# Patient Record
Sex: Female | Born: 1970 | Race: White | Hispanic: No | Marital: Married | Smoking: Current every day smoker
Health system: Southern US, Community
[De-identification: ages and names within clinical notes are randomized; demographics above are authoritative.]

## PROBLEM LIST (undated history)

## (undated) DIAGNOSIS — F32A Depression, unspecified: Secondary | ICD-10-CM

## (undated) DIAGNOSIS — K649 Unspecified hemorrhoids: Secondary | ICD-10-CM

## (undated) DIAGNOSIS — F329 Major depressive disorder, single episode, unspecified: Secondary | ICD-10-CM

## (undated) DIAGNOSIS — I10 Essential (primary) hypertension: Secondary | ICD-10-CM

---

## 2004-07-17 ENCOUNTER — Emergency Department: Payer: Self-pay | Admitting: Emergency Medicine

## 2004-10-13 ENCOUNTER — Emergency Department: Payer: Self-pay | Admitting: Emergency Medicine

## 2005-12-26 ENCOUNTER — Emergency Department: Payer: Self-pay | Admitting: Emergency Medicine

## 2006-01-01 ENCOUNTER — Emergency Department: Payer: Self-pay

## 2006-09-21 ENCOUNTER — Emergency Department: Payer: Self-pay | Admitting: Emergency Medicine

## 2006-09-25 ENCOUNTER — Emergency Department: Payer: Self-pay | Admitting: Emergency Medicine

## 2006-12-27 ENCOUNTER — Emergency Department: Payer: Self-pay | Admitting: Emergency Medicine

## 2007-03-02 ENCOUNTER — Emergency Department: Payer: Self-pay | Admitting: Internal Medicine

## 2007-11-28 ENCOUNTER — Emergency Department: Payer: Self-pay

## 2008-01-08 ENCOUNTER — Emergency Department: Payer: Self-pay | Admitting: Emergency Medicine

## 2008-05-13 ENCOUNTER — Emergency Department: Payer: Self-pay

## 2008-07-22 ENCOUNTER — Emergency Department: Payer: Self-pay | Admitting: Internal Medicine

## 2009-03-12 ENCOUNTER — Emergency Department: Payer: Self-pay | Admitting: Unknown Physician Specialty

## 2012-01-11 ENCOUNTER — Emergency Department: Payer: Self-pay | Admitting: Emergency Medicine

## 2012-01-11 LAB — URINALYSIS, COMPLETE
Bilirubin,UR: NEGATIVE
Glucose,UR: NEGATIVE mg/dL (ref 0–75)
Ketone: NEGATIVE
Leukocyte Esterase: NEGATIVE
Ph: 5 (ref 4.5–8.0)
RBC,UR: 5 /HPF (ref 0–5)
Squamous Epithelial: 45
WBC UR: 2 /HPF (ref 0–5)

## 2012-10-12 ENCOUNTER — Emergency Department: Payer: Self-pay | Admitting: Internal Medicine

## 2012-10-12 LAB — COMPREHENSIVE METABOLIC PANEL
Alkaline Phosphatase: 88 U/L (ref 50–136)
Bilirubin,Total: 0.1 mg/dL — ABNORMAL LOW (ref 0.2–1.0)
Chloride: 105 mmol/L (ref 98–107)
Creatinine: 0.56 mg/dL — ABNORMAL LOW (ref 0.60–1.30)
EGFR (African American): >60
Glucose: 93 mg/dL (ref 65–99)
Osmolality: 282 (ref 275–301)
Potassium: 3.6 mmol/L (ref 3.5–5.1)
Sodium: 141 mmol/L (ref 136–145)
Total Protein: 6.8 g/dL (ref 6.4–8.2)

## 2012-10-12 LAB — CBC
HGB: 13.4 g/dL (ref 12.0–16.0)
MCH: 32.9 pg (ref 26.0–34.0)
MCHC: 34.5 g/dL (ref 32.0–36.0)
MCV: 95 fL (ref 80–100)
Platelet: 267 10*3/uL (ref 150–440)
RBC: 4.08 10*6/uL (ref 3.80–5.20)
RDW: 13.5 % (ref 11.5–14.5)
WBC: 9 10*3/uL (ref 3.6–11.0)

## 2012-10-12 LAB — CK TOTAL AND CKMB (NOT AT ARMC)
CK, Total: 71 U/L (ref 21–215)
CK-MB: <0.5 ng/mL — ABNORMAL LOW (ref 0.5–3.6)

## 2012-10-12 LAB — TROPONIN I: Troponin-I: <0.02 ng/mL

## 2014-08-07 ENCOUNTER — Emergency Department
Admit: 2014-08-07 | Disposition: A | Discharge status: Home / Self Care | Payer: Self-pay | Admitting: Emergency Medicine

## 2015-04-18 ENCOUNTER — Encounter: Payer: Self-pay | Admitting: Emergency Medicine

## 2015-04-18 ENCOUNTER — Emergency Department
Admission: EM | Admit: 2015-04-18 | Discharge: 2015-04-18 | Disposition: A | Discharge status: Home / Self Care | Payer: No Typology Code available for payment source | Attending: Emergency Medicine | Admitting: Emergency Medicine

## 2015-04-18 DIAGNOSIS — R197 Diarrhea, unspecified: Secondary | ICD-10-CM | POA: Diagnosis not present

## 2015-04-18 DIAGNOSIS — F17210 Nicotine dependence, cigarettes, uncomplicated: Secondary | ICD-10-CM | POA: Diagnosis not present

## 2015-04-18 DIAGNOSIS — Z3202 Encounter for pregnancy test, result negative: Secondary | ICD-10-CM | POA: Insufficient documentation

## 2015-04-18 DIAGNOSIS — K648 Other hemorrhoids: Secondary | ICD-10-CM | POA: Insufficient documentation

## 2015-04-18 DIAGNOSIS — R109 Unspecified abdominal pain: Secondary | ICD-10-CM | POA: Diagnosis present

## 2015-04-18 DIAGNOSIS — Z79899 Other long term (current) drug therapy: Secondary | ICD-10-CM | POA: Insufficient documentation

## 2015-04-18 DIAGNOSIS — R110 Nausea: Secondary | ICD-10-CM | POA: Diagnosis not present

## 2015-04-18 DIAGNOSIS — R195 Other fecal abnormalities: Secondary | ICD-10-CM

## 2015-04-18 LAB — COMPREHENSIVE METABOLIC PANEL
ALT: 11 U/L — ABNORMAL LOW (ref 14–54)
ANION GAP: 8 (ref 5–15)
AST: 15 U/L (ref 15–41)
Albumin: 4 g/dL (ref 3.5–5.0)
Alkaline Phosphatase: 66 U/L (ref 38–126)
BILIRUBIN TOTAL: 0.5 mg/dL (ref 0.3–1.2)
BUN: 8 mg/dL (ref 6–20)
CHLORIDE: 108 mmol/L (ref 101–111)
CO2: 24 mmol/L (ref 22–32)
Calcium: 8.9 mg/dL (ref 8.9–10.3)
Creatinine, Ser: 0.59 mg/dL (ref 0.44–1.00)
Glucose, Bld: 94 mg/dL (ref 65–99)
POTASSIUM: 3.6 mmol/L (ref 3.5–5.1)
Sodium: 140 mmol/L (ref 135–145)
TOTAL PROTEIN: 7.5 g/dL (ref 6.5–8.1)

## 2015-04-18 LAB — CBC
HCT: 45.5 % (ref 35.0–47.0)
Hemoglobin: 15.7 g/dL (ref 12.0–16.0)
MCH: 34.4 pg — AB (ref 26.0–34.0)
MCHC: 34.5 g/dL (ref 32.0–36.0)
MCV: 99.7 fL (ref 80.0–100.0)
PLATELETS: 156 10*3/uL (ref 150–440)
RBC: 4.56 MIL/uL (ref 3.80–5.20)
RDW: 13.2 % (ref 11.5–14.5)
WBC: 9.8 10*3/uL (ref 3.6–11.0)

## 2015-04-18 LAB — LIPASE, BLOOD: LIPASE: 24 U/L (ref 11–51)

## 2015-04-18 LAB — PREGNANCY, URINE: PREG TEST UR: NEGATIVE

## 2015-04-18 MED ORDER — PHENYLEPHRINE IN HARD FAT 0.25 % RE SUPP: 1.0000 | Freq: Two times a day (BID) | RECTAL | Status: DC

## 2015-04-18 MED ORDER — ONDANSETRON 4 MG PO TBDP: 4.0000 mg | ORAL_TABLET | Freq: Three times a day (TID) | ORAL | Status: DC | PRN

## 2015-04-18 MED ORDER — ONDANSETRON 4 MG PO TBDP
4.0000 mg | ORAL_TABLET | Freq: Once | ORAL | Status: AC
  Administered 2015-04-18: 4 mg via ORAL
  Filled 2015-04-18: qty 1

## 2015-04-18 MED ORDER — LIDOCAINE HCL 2 % EX GEL
1.0000 "application " | Freq: Once | CUTANEOUS | Status: AC
  Administered 2015-04-18: 1 via TOPICAL
  Filled 2015-04-18: qty 5

## 2015-04-18 NOTE — Discharge Instructions (Signed)
For your hemorrhoids, please take the suppositories, take 2 sitz baths daily, and eating a high-fiber diet to prevent constipation which can worsen your hemorrhoids. For your nausea he may take Zofran.   Please return to the emergency department if you develop abdominal pain, fever, bleeding, or any other symptoms concerning to you.

## 2015-04-18 NOTE — ED Notes (Signed)
Pt uprite on stretcher in exam room with no distress noted; st nausea last couple wks; began taking OTC prilosec 9 days ago with relielf; on Thursday began having "pressure to rectum and burning like acid with loose stools"; denies nausea at present, denies urinary c/o; denies hx of same; denies abd pain; +BS, abd soft/nondist; only c/o rectal pain; denies any blood noted in BM

## 2015-04-18 NOTE — ED Provider Notes (Signed)
[facility] Emergency Department Provider Note  ____________________________________________  Time seen: Approximately 7:37 AM  I have reviewed the triage vital signs and the nursing notes.   HISTORY  Chief Complaint Abdominal Pain    HPI Hannah Carey is a 44 y.o. female with a history of hemorrhoids presenting with several weeks of nausea, rectal pressure, and rectal pain with defecation, and diarrhea.  Patient reports nausea without vomiting; no headache. For the past 8 days, she has also had a burning sensation with defecation; she is having loose nonbloody stools with this. She also reports feeling pressure "all day." She reports that she is usually constipated, has not had any change in her diet but does not eat healthily. She has not noted any blood in her stool or dark stools. She is not had fever, foreign body to the rectum, or abdominal pain.She tried Prilosec for her nausea which did not help, and Preparation H for her rectal burning which did not help.   History reviewed. No pertinent past medical history.  There are no active problems to display for this patient.   History reviewed. No pertinent past surgical history.  Current Outpatient Rx  Name  Route  Sig  Dispense  Refill  . Aspirin-Salicylamide-Caffeine (BC HEADACHE POWDER PO)   Oral   Take 1 packet by mouth as needed.         Marland Kitchen. omeprazole (PRILOSEC) 20 MG capsule   Oral   Take 20 mg by mouth daily.         . sertraline (ZOLOFT) 100 MG tablet   Oral   Take 100 mg by mouth daily.           Allergies Review of patient's allergies indicates no known allergies.  No family history on file.  Social History Social History  Substance Use Topics  . Smoking status: Current Every Day Smoker -- 1.00 packs/day    Types: Cigarettes  . Smokeless tobacco: None  . Alcohol Use: No    Review of Systems Constitutional: No fever/chills. No lightheadedness or shortness of  breath. Eyes: No visual changes. ENT: No sore throat. Cardiovascular: Denies chest pain, palpitations. Respiratory: Denies shortness of breath.  No cough. Gastrointestinal: No abdominal pain.  Positive nausea, no vomiting.  Positive diarrhea.  No constipation. Genitourinary: Negative for dysuria. Positive for rectal pain and pressure. Musculoskeletal: Negative for back pain. Skin: Negative for rash. Neurological: Negative for headaches, focal weakness or numbness.  10-point ROS otherwise negative.  ____________________________________________   PHYSICAL EXAM:  VITAL SIGNS: ED Triage Vitals  Enc Vitals Group     BP 04/18/15 0636 134/82 mmHg     Pulse Rate 04/18/15 0636 77     Resp 04/18/15 0636 18     Temp 04/18/15 0636 98.9 F (37.2 C)     Temp src --      SpO2 04/18/15 0636 96 %     Weight 04/18/15 0636 135 lb (61.236 kg)     Height 04/18/15 0636 5\' 3"  (1.6 m)     Head Cir --      Peak Flow --      Pain Score 04/18/15 0637 5     Pain Loc --      Pain Edu? --      Excl. in GC? --     Constitutional: Alert and oriented. Well appearing and in no acute distress. Answer question appropriately. Eyes: Conjunctivae are normal.  EOMI. no scleral icterus. Head: Atraumatic. Nose: No congestion/rhinnorhea. Mouth/Throat:  Mucous membranes are moist.  Neck: No stridor.  Supple.   Cardiovascular: Normal rate, regular rhythm. No murmurs, rubs or gallops.  Respiratory: Normal respiratory effort.  No retractions. Lungs CTAB.  No wheezes, rales or ronchi. Gastrointestinal: Soft and nontender. No distention. No peritoneal signs. Genitourinary: Nonthrombosed nonbleeding hemorrhoid externally at 6:00. Multiple palpable internal hemorrhoids with mild to moderate discomfort. No evidence of fissures. Brown stool. Musculoskeletal: No LE edema.  Neurologic:  Normal speech and language. No gross focal neurologic deficits are appreciated.  Skin:  Skin is warm, dry and intact. No rash  noted. Psychiatric: Mood and affect are normal. Speech and behavior are normal.  Normal judgement.  ____________________________________________   LABS (all labs ordered are listed, but only abnormal results are displayed)  Labs Reviewed  CBC - Abnormal; Notable for the following:    MCH 34.4 (*)    All other components within normal limits  COMPREHENSIVE METABOLIC PANEL - Abnormal; Notable for the following:    ALT 11 (*)    All other components within normal limits  LIPASE, BLOOD  PREGNANCY, URINE   ____________________________________________  EKG  Not indicated ____________________________________________  RADIOLOGY  No results found.  ____________________________________________   PROCEDURES  Procedure(s) performed: None  Critical Care performed: No ____________________________________________   INITIAL IMPRESSION / ASSESSMENT AND PLAN / ED COURSE  Pertinent labs & imaging results that were available during my care of the patient were reviewed by me and considered in my medical decision making (see chart for details).  44 y.o. female with a history of poor diet and constipation presenting with several days of rectal burning associated now with diarrhea and palpable hemorrhoids on exam. The etiology of her nausea is unlikely to be related to her hemorrhoids and let us it is related to pain. I do not think she has acute intracranial pathology. I will evaluate her for electrolyte disturbances and treat her nausea symptomatically. We will also rule out pregnancy. Patient does have hemorrhoids which are likely the cause of her rectal pain and pressure. I do not see or palpate an anal fissure, and given that she is afebrile, nontoxic and that her exam shows mild to moderate discomfort rather than severe discomfort, I think rectal abscess is very unlikely. We will plan symptomatic treatment and have her follow-up with her primary care physician at the [facility]  clinic.  ----------------------------------------- 8:31 AM on 04/18/2015 -----------------------------------------  The patient's labs are reassuring and she is not anemic. She is also not pregnant. I will plan to discharge her home with treatment for hemorrhoids and nausea, and have her follow-up with her primary care physician at the [facility] clinic. The patient's symptoms have improved in the emergency department and she understands return precautions as well as follow-up instructions.  ____________________________________________  FINAL CLINICAL IMPRESSION(S) / ED DIAGNOSES  Final diagnoses:  Nausea  Internal hemorrhoids  Loose stools      NEW MEDICATIONS STARTED DURING THIS VISIT:  New Prescriptions   No medications on file     Rockne Menghini, MD 04/18/15 [phone]

## 2015-04-18 NOTE — ED Notes (Signed)
Pt in with co mid abd pain x 1 week with loose stools and nausea.

## 2015-04-29 ENCOUNTER — Encounter: Payer: Self-pay | Admitting: *Deleted

## 2015-04-29 ENCOUNTER — Emergency Department
Admission: EM | Admit: 2015-04-29 | Discharge: 2015-04-29 | Disposition: A | Discharge status: Home / Self Care | Payer: No Typology Code available for payment source | Attending: Emergency Medicine | Admitting: Emergency Medicine

## 2015-04-29 DIAGNOSIS — N3001 Acute cystitis with hematuria: Secondary | ICD-10-CM | POA: Insufficient documentation

## 2015-04-29 DIAGNOSIS — F17210 Nicotine dependence, cigarettes, uncomplicated: Secondary | ICD-10-CM | POA: Diagnosis not present

## 2015-04-29 DIAGNOSIS — Z3202 Encounter for pregnancy test, result negative: Secondary | ICD-10-CM | POA: Diagnosis not present

## 2015-04-29 DIAGNOSIS — Z79899 Other long term (current) drug therapy: Secondary | ICD-10-CM | POA: Insufficient documentation

## 2015-04-29 DIAGNOSIS — K6289 Other specified diseases of anus and rectum: Secondary | ICD-10-CM | POA: Diagnosis present

## 2015-04-29 HISTORY — DX: Major depressive disorder, single episode, unspecified: F32.9

## 2015-04-29 HISTORY — DX: Depression, unspecified: F32.A

## 2015-04-29 LAB — URINALYSIS COMPLETE WITH MICROSCOPIC (ARMC ONLY)
BACTERIA UA: NONE SEEN
Bilirubin Urine: NEGATIVE
Glucose, UA: NEGATIVE mg/dL
KETONES UR: NEGATIVE mg/dL
Leukocytes, UA: NEGATIVE
NITRITE: NEGATIVE
PH: 5 (ref 5.0–8.0)
PROTEIN: NEGATIVE mg/dL
SPECIFIC GRAVITY, URINE: 1.02 (ref 1.005–1.030)

## 2015-04-29 LAB — POCT PREGNANCY, URINE: PREG TEST UR: NEGATIVE

## 2015-04-29 MED ORDER — IBUPROFEN 800 MG PO TABS: 800.0000 mg | ORAL_TABLET | Freq: Three times a day (TID) | ORAL | Status: DC | PRN

## 2015-04-29 MED ORDER — SULFAMETHOXAZOLE-TRIMETHOPRIM 800-160 MG PO TABS: 1.0000 | ORAL_TABLET | Freq: Two times a day (BID) | ORAL | Status: DC

## 2015-04-29 MED ORDER — CYCLOBENZAPRINE HCL 5 MG PO TABS: 5.0000 mg | ORAL_TABLET | Freq: Three times a day (TID) | ORAL | Status: DC | PRN

## 2015-04-29 NOTE — ED Notes (Addendum)
Pt reports being in ED 12/16 due to rectal pressure. Pt was d/x with hemorrhoids that have since become "worse" per pt. Pt reports pain has moved into vagina and into lower back as well. Pt reports pain feels like muscle spasms. Pt denies vaginal d/c or fevers. Pt reports feeling as though she has to urinate "all the time" but denies burning with urination.

## 2015-04-29 NOTE — Discharge Instructions (Signed)

## 2015-04-29 NOTE — ED Notes (Signed)
Patient to ER for lower back pain, rectal pressure, and vaginal pain.

## 2015-04-29 NOTE — ED Provider Notes (Signed)
[facility] Emergency Department Provider Note  ____________________________________________  Time seen: Approximately 12:12 PM  I have reviewed the triage vital signs and the nursing notes.   HISTORY  Chief Complaint No chief complaint on file.    HPI Hannah Carey is a 44 y.o. female who presents for evaluation of rectal pressure vaginal pressure and pain with urination. Patient states that she was seen here 11 days ago for hemorrhoids and feels like that pain her pressure is now burning and radiating forward down her legs like a muscle spasm. Denies constipation states that she had normal bowel movement this morning.   Past Medical History  Diagnosis Date  . Depression     There are no active problems to display for this patient.   History reviewed. No pertinent past surgical history.  Current Outpatient Rx  Name  Route  Sig  Dispense  Refill  . Aspirin-Salicylamide-Caffeine (BC HEADACHE POWDER PO)   Oral   Take 1 packet by mouth as needed.         . cyclobenzaprine (FLEXERIL) 5 MG tablet   Oral   Take 1 tablet (5 mg total) by mouth every 8 (eight) hours as needed for muscle spasms.   30 tablet   0   . ibuprofen (ADVIL,MOTRIN) 800 MG tablet   Oral   Take 1 tablet (800 mg total) by mouth every 8 (eight) hours as needed.   30 tablet   0   . omeprazole (PRILOSEC) 20 MG capsule   Oral   Take 20 mg by mouth daily.         . ondansetron (ZOFRAN-ODT) 4 MG disintegrating tablet   Oral   Take 1 tablet (4 mg total) by mouth every 8 (eight) hours as needed for nausea or vomiting.   20 tablet   0   . phenylephrine (,USE FOR PREPARATION-H,) 0.25 % suppository   Rectal   Place 1 suppository rectally 2 (two) times daily.   12 suppository   0   . sertraline (ZOLOFT) 100 MG tablet   Oral   Take 100 mg by mouth daily.         Marland Kitchen. sulfamethoxazole-trimethoprim (BACTRIM DS,SEPTRA DS) 800-160 MG tablet   Oral   Take 1 tablet by mouth 2  (two) times daily.   20 tablet   0     Allergies Review of patient's allergies indicates no known allergies.  History reviewed. No pertinent family history.  Social History Social History  Substance Use Topics  . Smoking status: Current Every Day Smoker -- 1.00 packs/day    Types: Cigarettes  . Smokeless tobacco: None  . Alcohol Use: No    Review of Systems Constitutional: No fever/chills Eyes: No visual changes. ENT: No sore throat. Cardiovascular: Denies chest pain. Respiratory: Denies shortness of breath. Gastrointestinal: No abdominal pain.  No nausea, no vomiting.  No diarrhea.  No constipation. Genitourinary: Negative for dysuria. Positive for hemorrhoids and rectal pressure. Musculoskeletal: Negative for back pain. Skin: Negative for rash. Neurological: Negative for headaches, focal weakness or numbness.  10-point ROS otherwise negative.  ____________________________________________   PHYSICAL EXAM:  VITAL SIGNS: ED Triage Vitals  Enc Vitals Group     BP 04/29/15 1200 143/88 mmHg     Pulse Rate 04/29/15 1200 92     Resp 04/29/15 1200 16     Temp 04/29/15 1200 98.1 F (36.7 C)     Temp Source 04/29/15 1200 Oral     SpO2 04/29/15 1200 96 %  Weight 04/29/15 1200 135 lb (61.236 kg)     Height 04/29/15 1200  (1.6 m)     Head Cir --      Peak Flow --      Pain Score 04/29/15 1203 6     Pain Loc --      Pain Edu? --      Excl. in GC? --     Constitutional: Alert and oriented. Well appearing and in no acute distress. Cardiovascular: Normal rate, regular rhythm. Grossly normal heart sounds.  Good peripheral circulation. Respiratory: Normal respiratory effort.  No retractions. Lungs CTAB. Gastrointestinal: Soft and nontender. No distention. No abdominal bruits. No CVA tenderness. Musculoskeletal: No lower extremity tenderness nor edema.  No joint effusions. Neurologic:  Normal speech and language. No gross focal neurologic deficits are appreciated.  No gait instability. Skin:  Skin is warm, dry and intact. No rash noted. Psychiatric: Mood and affect are normal. Speech and behavior are normal.  ____________________________________________   LABS (all labs ordered are listed, but only abnormal results are displayed)  Labs Reviewed  URINALYSIS COMPLETEWITH MICROSCOPIC ([facility] ONLY) - Abnormal; Notable for the following:    Color, Urine YELLOW (*)    APPearance HAZY (*)    Hgb urine dipstick 2+ (*)    Squamous Epithelial / LPF 6-30 (*)    All other components within normal limits  POCT PREGNANCY, URINE     PROCEDURES  Procedure(s) performed: None  Critical Care performed: No  ____________________________________________   INITIAL IMPRESSION / ASSESSMENT AND PLAN / ED COURSE  Pertinent labs & imaging results that were available during my care of the patient were reviewed by me and considered in my medical decision making (see chart for details).  To UTI with hematuria. Rx given for Bactrim DS twice a day Flexeril 5 mg for muscle spasms and Motrin 800 mg 3 times a day. Patient follow-up with PCP or return to the ER with any worsening symptomology. ____________________________________________   FINAL CLINICAL IMPRESSION(S) / ED DIAGNOSES  Final diagnoses:  Acute cystitis with hematuria      Evangeline Dakin, PA-C 04/29/15 1254  Myrna Blazer, MD 04/29/15 [phone]

## 2015-05-06 ENCOUNTER — Emergency Department
Admission: EM | Admit: 2015-05-06 | Discharge: 2015-05-06 | Disposition: A | Discharge status: Home / Self Care | Payer: BLUE CROSS/BLUE SHIELD | Attending: Emergency Medicine | Admitting: Emergency Medicine

## 2015-05-06 ENCOUNTER — Encounter: Payer: Self-pay | Admitting: Emergency Medicine

## 2015-05-06 DIAGNOSIS — M5418 Radiculopathy, sacral and sacrococcygeal region: Secondary | ICD-10-CM | POA: Insufficient documentation

## 2015-05-06 DIAGNOSIS — R200 Anesthesia of skin: Secondary | ICD-10-CM | POA: Diagnosis not present

## 2015-05-06 DIAGNOSIS — Z79899 Other long term (current) drug therapy: Secondary | ICD-10-CM | POA: Diagnosis not present

## 2015-05-06 DIAGNOSIS — F17210 Nicotine dependence, cigarettes, uncomplicated: Secondary | ICD-10-CM | POA: Diagnosis not present

## 2015-05-06 DIAGNOSIS — M533 Sacrococcygeal disorders, not elsewhere classified: Secondary | ICD-10-CM | POA: Diagnosis present

## 2015-05-06 HISTORY — DX: Unspecified hemorrhoids: K64.9

## 2015-05-06 MED ORDER — DEXAMETHASONE SODIUM PHOSPHATE 10 MG/ML IJ SOLN
10.0000 mg | Freq: Once | INTRAMUSCULAR | Status: AC
  Administered 2015-05-06: 10 mg via INTRAMUSCULAR
  Filled 2015-05-06: qty 1

## 2015-05-06 MED ORDER — HYDROCODONE-ACETAMINOPHEN 5-325 MG PO TABS: 1.0000 | ORAL_TABLET | ORAL | Status: DC | PRN

## 2015-05-06 MED ORDER — MELOXICAM 15 MG PO TABS: 15.0000 mg | ORAL_TABLET | Freq: Every day | ORAL | Status: DC

## 2015-05-06 NOTE — Discharge Instructions (Signed)
Lumbosacral Radiculopathy °Lumbosacral radiculopathy is a condition that involves the spinal nerves and nerve roots in the low back and bottom of the spine. The condition develops when these nerves and nerve roots move out of place or become inflamed and cause symptoms. °CAUSES °This condition may be caused by: °· Pressure from a disk that bulges out of place (herniated disk). A disk is a plate of cartilage that separates bones in the spine. °· Disk degeneration. °· A narrowing of the bones of the lower back (spinal stenosis). °· A tumor. °· An infection. °· An injury that places sudden pressure on the disks that cushion the bones of your lower spine. °RISK FACTORS °This condition is more likely to develop in: °· Males aged 30-50 years. °· Females aged 50-60 years. °· People who lift improperly. °· People who are overweight or live a sedentary lifestyle. °· People who smoke. °· People who perform repetitive activities that strain the spine. °SYMPTOMS °Symptoms of this condition include: °· Pain that goes down from the back into the legs (sciatica). This is the most common symptom. The pain may be worse with sitting, coughing, or sneezing. °· Pain and numbness in the arms and legs. °· Muscle weakness. °· Tingling. °· Loss of bladder control or bowel control. °DIAGNOSIS °This condition is diagnosed with a physical exam and medical history. If the pain is lasting, you may have tests, such as: °· MRI scan. °· X-ray. °· CT scan. °· Myelogram. °· Nerve conduction study. °TREATMENT °This condition is often treated with: °· Hot packs and ice applied to affected areas. °· Stretches to improve flexibility. °· Exercises to strengthen back muscles. °· Physical therapy. °· Pain medicine. °· A steroid injection in the spine. °In some cases, no treatment is needed. If the condition is long-lasting (chronic), or if symptoms are severe, treatment may involve surgery or lifestyle changes, such as following a weight loss plan. °HOME  CARE INSTRUCTIONS °Medicines °· Take medicines only as directed by your health care provider. °· Do not drive or operate heavy machinery while taking pain medicine. °Injury Care °· Apply a heat pack to the injured area as directed by your health care provider. °· Apply ice to the affected area: °¨ Put ice in a plastic bag. °¨ Place a towel between your skin and the bag. °¨ Leave the ice on for 20-30 minutes, every 2 hours while you are awake or as needed. Or, leave the ice on for as long as directed by your health care provider. °Other Instructions °· If you were shown how to do any exercises or stretches, do them as directed by your health care provider. °· If your health care provider prescribed a diet or exercise program, follow it as directed. °· Keep all follow-up visits as directed by your health care provider. This is important. °SEEK MEDICAL CARE IF: °· Your pain does not improve over time even when taking pain medicines. °SEEK IMMEDIATE MEDICAL CARE IF: °· Your develop severe pain. °· Your pain suddenly gets worse. °· You develop increasing weakness in your legs. °· You lose the ability to control your bladder or bowel. °· You have difficulty walking or balancing. °· You have a fever. °  °This information is not intended to replace advice given to you by your health care provider. Make sure you discuss any questions you have with your health care provider. °  °Document Released: 04/21/2005 Document Revised: 09/05/2014 Document Reviewed: 04/17/2014 °Elsevier Interactive Patient Education ©2016 [employer] ° °

## 2015-05-06 NOTE — ED Notes (Signed)
Tailbone pain radiates down left leg - dx with hemorrhoids last week but believes that's not the case

## 2015-05-06 NOTE — ED Notes (Signed)
On antibiotic for uti, muscle relaxer and nsaids for this pain

## 2015-05-06 NOTE — ED Notes (Signed)
States she developed pain to tailbone which radiates into left hip about 3 weeks ago  Denies any injury ambulates well

## 2015-05-06 NOTE — ED Provider Notes (Signed)
[facility] Emergency Department Provider Note ____________________________________________  Time seen: Approximately 12:58 PM  I have reviewed the triage vital signs and the nursing notes.   HISTORY  Chief Complaint Tailbone Pain   HPI Hannah Carey is a 45 y.o. female who presents to the emergency department for evaluation of pain in her left buttock that radiates around into the rectal and vaginal area. She states that the pain also occasionally radiates down the posterior aspect of the left leg into the knee and at times into the heel or toes. She states that she has been evaluated for this by her primary care at [facility] clinic as well as  being seen here. She has been advised that she has a hemorrhoid. She does not feel that this is the cause of her symptoms. She states that her primary care provider did a "internal exam." She states that the exam was uncomfortable but not painful. She has had no rectal bleeding. She has had no vaginal discharge. Pain in the buttock and rectum is worse with sitting, but improves with standing. She's had little relief with muscle relaxers and ibuprofen. She states that she stands in a stationary position 8 hours per day and makes repetitive motions side to side.She denies loss of bowel or bladder control.   Past Medical History  Diagnosis Date  . Depression   . Hemorrhoids     There are no active problems to display for this patient.   History reviewed. No pertinent past surgical history.  Current Outpatient Rx  Name  Route  Sig  Dispense  Refill  . Aspirin-Salicylamide-Caffeine (BC HEADACHE POWDER PO)   Oral   Take 1 packet by mouth as needed.         . cyclobenzaprine (FLEXERIL) 5 MG tablet   Oral   Take 1 tablet (5 mg total) by mouth every 8 (eight) hours as needed for muscle spasms.   30 tablet   0   . HYDROcodone-acetaminophen (NORCO/VICODIN) 5-325 MG tablet   Oral   Take 1 tablet by mouth every 4 (four)  hours as needed.   12 tablet   0   . meloxicam (MOBIC) 15 MG tablet   Oral   Take 1 tablet (15 mg total) by mouth daily.   30 tablet   0   . omeprazole (PRILOSEC) 20 MG capsule   Oral   Take 20 mg by mouth daily.         . ondansetron (ZOFRAN-ODT) 4 MG disintegrating tablet   Oral   Take 1 tablet (4 mg total) by mouth every 8 (eight) hours as needed for nausea or vomiting.   20 tablet   0   . sertraline (ZOLOFT) 100 MG tablet   Oral   Take 100 mg by mouth daily.           Allergies Review of patient's allergies indicates no known allergies.  History reviewed. No pertinent family history.  Social History Social History  Substance Use Topics  . Smoking status: Current Every Day Smoker -- 1.00 packs/day    Types: Cigarettes  . Smokeless tobacco: None  . Alcohol Use: No    Review of Systems Constitutional: No fever/chills Eyes: No visual changes. ENT: No sore throat. Cardiovascular: Denies chest pain. Respiratory: Denies shortness of breath. Gastrointestinal: No abdominal pain.  No nausea, no vomiting.  No diarrhea.  No constipation. Genitourinary: Negative for dysuria. Musculoskeletal: Negative for back pain. Skin: Negative for rash. Neurological: Negative for headaches,  focal weakness. Positive for intermittent numbness in the left leg.  10-point ROS otherwise negative.  ____________________________________________   PHYSICAL EXAM:  VITAL SIGNS: ED Triage Vitals  Enc Vitals Group     BP 05/06/15 1128 131/80 mmHg     Pulse Rate 05/06/15 1128 117     Resp 05/06/15 1128 18     Temp 05/06/15 1128 98.3 F (36.8 C)     Temp Source 05/06/15 1128 Oral     SpO2 05/06/15 1128 98 %     Weight 05/06/15 1128 132 lb (59.875 kg)     Height 05/06/15 1128 5\' 3"  (1.6 m)     Head Cir --      Peak Flow --      Pain Score 05/06/15 1128 6     Pain Loc --      Pain Edu? --      Excl. in GC? --     Constitutional: Alert and oriented. Well appearing and in no  acute distress. Eyes: Conjunctivae are normal. PERRL. EOMI. Head: Atraumatic. Nose: No congestion/rhinnorhea. Mouth/Throat: Mucous membranes are moist.  Oropharynx non-erythematous. Neck: No stridor.   Cardiovascular: Normal rate, regular rhythm. Grossly normal heart sounds.  Good peripheral circulation. Respiratory: Normal respiratory effort.  No retractions. Lungs CTAB. Gastrointestinal: Soft and nontender. No distention.  Genitourinary/rectal: No hemorrhoid noted on visual exam. Musculoskeletal: No lower extremity tenderness nor edema.  No joint effusions. Neurologic:  Normal speech and language. No gross focal neurologic deficits are appreciated. No gait instability. Pain and numbness is triggered with straight leg raise at approximately 60 on the left, straight leg raise is negative on the right. Skin:  Skin is warm, dry and intact. No rash noted. Psychiatric: Mood and affect are normal. Speech and behavior are normal.  ____________________________________________   LABS (all labs ordered are listed, but only abnormal results are displayed)  Labs Reviewed - No data to display ____________________________________________  EKG   ____________________________________________  RADIOLOGY  Not indicated ____________________________________________   PROCEDURES  Procedure(s) performed: None  Critical Care performed: No  ____________________________________________   INITIAL IMPRESSION / ASSESSMENT AND PLAN / ED COURSE  Pertinent labs & imaging results that were available during my care of the patient were reviewed by me and considered in my medical decision making (see chart for details).  Patient was given Decadron 10 mg IM while in the emergency department. We discussed standing with her right foot elevated on a small box or stepstool while at work. She will be given a prescription for meloxicam and a three-day dose of Norco to be used for severe pain. She is to  follow-up with orthopedics for symptoms that are not improving with her medications. She was instructed to return to the emergency department for symptoms that change or worsen if she is unable to schedule an appointment with her primary care provider or orthopedics. ____________________________________________   FINAL CLINICAL IMPRESSION(S) / ED DIAGNOSES  Final diagnoses:  Radiculopathy of sacrococcygeal region      Chinita PesterCari B Ayiana Winslett, FNP 05/06/15 1322  Jene Everyobert Kinner, MD 05/06/15 1530

## 2015-05-08 ENCOUNTER — Emergency Department: Payer: BLUE CROSS/BLUE SHIELD

## 2015-05-08 ENCOUNTER — Encounter: Payer: Self-pay | Admitting: *Deleted

## 2015-05-08 ENCOUNTER — Emergency Department
Admission: EM | Admit: 2015-05-08 | Discharge: 2015-05-08 | Disposition: A | Discharge status: Home / Self Care | Payer: BLUE CROSS/BLUE SHIELD | Attending: Emergency Medicine | Admitting: Emergency Medicine

## 2015-05-08 DIAGNOSIS — F17210 Nicotine dependence, cigarettes, uncomplicated: Secondary | ICD-10-CM | POA: Diagnosis not present

## 2015-05-08 DIAGNOSIS — R109 Unspecified abdominal pain: Secondary | ICD-10-CM

## 2015-05-08 DIAGNOSIS — Z791 Long term (current) use of non-steroidal anti-inflammatories (NSAID): Secondary | ICD-10-CM | POA: Insufficient documentation

## 2015-05-08 DIAGNOSIS — Z79899 Other long term (current) drug therapy: Secondary | ICD-10-CM | POA: Insufficient documentation

## 2015-05-08 DIAGNOSIS — R319 Hematuria, unspecified: Secondary | ICD-10-CM | POA: Insufficient documentation

## 2015-05-08 DIAGNOSIS — Z3202 Encounter for pregnancy test, result negative: Secondary | ICD-10-CM | POA: Insufficient documentation

## 2015-05-08 DIAGNOSIS — M5432 Sciatica, left side: Secondary | ICD-10-CM

## 2015-05-08 LAB — CBC WITH DIFFERENTIAL/PLATELET
BASOS PCT: 0 %
Basophils Absolute: 0 10*3/uL (ref 0–0.1)
EOS PCT: 1 %
Eosinophils Absolute: 0.1 10*3/uL (ref 0–0.7)
HEMATOCRIT: 43.4 % (ref 35.0–47.0)
Hemoglobin: 14.9 g/dL (ref 12.0–16.0)
Lymphocytes Relative: 23 %
Lymphs Abs: 2.5 10*3/uL (ref 1.0–3.6)
MCH: 34 pg (ref 26.0–34.0)
MCHC: 34.4 g/dL (ref 32.0–36.0)
MCV: 98.8 fL (ref 80.0–100.0)
MONO ABS: 1 10*3/uL — AB (ref 0.2–0.9)
MONOS PCT: 9 %
NEUTROS ABS: 7.2 10*3/uL — AB (ref 1.4–6.5)
Neutrophils Relative %: 67 %
PLATELETS: 135 10*3/uL — AB (ref 150–440)
RBC: 4.4 MIL/uL (ref 3.80–5.20)
RDW: 13 % (ref 11.5–14.5)
WBC: 11 10*3/uL (ref 3.6–11.0)

## 2015-05-08 LAB — URINALYSIS COMPLETE WITH MICROSCOPIC (ARMC ONLY)
BILIRUBIN URINE: NEGATIVE
Glucose, UA: NEGATIVE mg/dL
Hgb urine dipstick: NEGATIVE
KETONES UR: NEGATIVE mg/dL
LEUKOCYTES UA: NEGATIVE
NITRITE: NEGATIVE
PH: 6 (ref 5.0–8.0)
PROTEIN: NEGATIVE mg/dL
SPECIFIC GRAVITY, URINE: 1.023 (ref 1.005–1.030)

## 2015-05-08 LAB — COMPREHENSIVE METABOLIC PANEL
ALK PHOS: 53 U/L (ref 38–126)
ALT: 9 U/L — ABNORMAL LOW (ref 14–54)
ANION GAP: 7 (ref 5–15)
AST: 13 U/L — ABNORMAL LOW (ref 15–41)
Albumin: 4.1 g/dL (ref 3.5–5.0)
BILIRUBIN TOTAL: 0.2 mg/dL — AB (ref 0.3–1.2)
BUN: 12 mg/dL (ref 6–20)
CALCIUM: 8.9 mg/dL (ref 8.9–10.3)
CO2: 29 mmol/L (ref 22–32)
Chloride: 104 mmol/L (ref 101–111)
Creatinine, Ser: 0.56 mg/dL (ref 0.44–1.00)
GFR calc non Af Amer: >60 mL/min (ref >60)
Glucose, Bld: 95 mg/dL (ref 65–99)
POTASSIUM: 3.2 mmol/L — AB (ref 3.5–5.1)
SODIUM: 140 mmol/L (ref 135–145)
TOTAL PROTEIN: 6.7 g/dL (ref 6.5–8.1)

## 2015-05-08 LAB — LIPASE, BLOOD: Lipase: 20 U/L (ref 11–51)

## 2015-05-08 LAB — PREGNANCY, URINE: Preg Test, Ur: NEGATIVE

## 2015-05-08 MED ORDER — CARISOPRODOL 350 MG PO TABS: 350.0000 mg | ORAL_TABLET | Freq: Three times a day (TID) | ORAL | Status: DC | PRN

## 2015-05-08 MED ORDER — PREDNISONE 20 MG PO TABS
60.0000 mg | ORAL_TABLET | Freq: Once | ORAL | Status: AC
  Administered 2015-05-08: 60 mg via ORAL
  Filled 2015-05-08: qty 3

## 2015-05-08 MED ORDER — METHYLPREDNISOLONE 4 MG PO TABS: ORAL_TABLET | ORAL | Status: DC

## 2015-05-08 MED ORDER — POTASSIUM CHLORIDE CRYS ER 20 MEQ PO TBCR
40.0000 meq | EXTENDED_RELEASE_TABLET | Freq: Once | ORAL | Status: AC
  Administered 2015-05-08: 40 meq via ORAL

## 2015-05-08 NOTE — ED Notes (Signed)
Pt complains of right flank pain with blood in urine for three weeks, pt seen in ER Sunday

## 2015-05-08 NOTE — ED Provider Notes (Signed)
[facility] Emergency Department Provider Note  ____________________________________________  Time seen: Approximately 725 PM  I have reviewed the triage vital signs and the nursing notes.   HISTORY  Chief Complaint Flank Pain    HPI Hannah Carey is a 45 y.o. female with a history of depression and hemorrhoids is presenting today with left flank and buttock pain as well as hematuria. She says that the pain is been ongoing for several weeks and has not been relieved despite treatment for a urinary tract infection as well as muscle relaxers and NSAIDs. He describes the pain as a "spasm" which is worse when she sits. She denies any loss of control of her bowel or bladder.She says that she does have history of kidney stones. Denies any pain to the midline of her back. Says the pain is only mild at this time. Denies any burning with urination at this time.   Past Medical History  Diagnosis Date  . Depression   . Hemorrhoids     There are no active problems to display for this patient.   History reviewed. No pertinent past surgical history.  Current Outpatient Rx  Name  Route  Sig  Dispense  Refill  . Aspirin-Salicylamide-Caffeine (BC HEADACHE POWDER PO)   Oral   Take 1 packet by mouth as needed.         . cyclobenzaprine (FLEXERIL) 5 MG tablet   Oral   Take 1 tablet (5 mg total) by mouth every 8 (eight) hours as needed for muscle spasms.   30 tablet   0   . HYDROcodone-acetaminophen (NORCO/VICODIN) 5-325 MG tablet   Oral   Take 1 tablet by mouth every 4 (four) hours as needed.   12 tablet   0   . meloxicam (MOBIC) 15 MG tablet   Oral   Take 1 tablet (15 mg total) by mouth daily.   30 tablet   0   . omeprazole (PRILOSEC) 20 MG capsule   Oral   Take 20 mg by mouth daily.         . ondansetron (ZOFRAN-ODT) 4 MG disintegrating tablet   Oral   Take 1 tablet (4 mg total) by mouth every 8 (eight) hours as needed for nausea or vomiting.  20 tablet   0   . sertraline (ZOLOFT) 100 MG tablet   Oral   Take 100 mg by mouth daily.           Allergies Review of patient's allergies indicates no known allergies.  No family history on file.  Social History Social History  Substance Use Topics  . Smoking status: Current Every Day Smoker -- 1.00 packs/day    Types: Cigarettes  . Smokeless tobacco: None  . Alcohol Use: No    Review of Systems Constitutional: No fever/chills Eyes: No visual changes. ENT: No sore throat. Cardiovascular: Denies chest pain. Respiratory: Denies shortness of breath. Gastrointestinal: No abdominal pain.  No nausea, no vomiting.  No diarrhea.  No constipation. Genitourinary: Negative for dysuria. Musculoskeletal: Negative for back pain. Skin: Negative for rash. Neurological: Negative for headaches, focal weakness or numbness.  10-point ROS otherwise negative.  ____________________________________________   PHYSICAL EXAM:  VITAL SIGNS: ED Triage Vitals  Enc Vitals Group     BP 05/08/15 1724 132/86 mmHg     Pulse Rate 05/08/15 1724 89     Resp 05/08/15 1724 20     Temp 05/08/15 1724 98.2 F (36.8 C)     Temp Source 05/08/15  1724 Oral     SpO2 05/08/15 1724 96 %     Weight 05/08/15 1724 132 lb (59.875 kg)     Height 05/08/15 1724 5\' 3"  (1.6 m)     Head Cir --      Peak Flow --      Pain Score 05/08/15 1724 5     Pain Loc --      Pain Edu? --      Excl. in GC? --     Constitutional: Alert and oriented. Well appearing and in no acute distress. Eyes: Conjunctivae are normal. PERRL. EOMI. Head: Atraumatic. Nose: No congestion/rhinnorhea. Mouth/Throat: Mucous membranes are moist.  Oropharynx non-erythematous. Neck: No stridor.   Cardiovascular: Normal rate, regular rhythm. Grossly normal heart sounds.  Good peripheral circulation. Respiratory: Normal respiratory effort.  No retractions. Lungs CTAB. Gastrointestinal: Soft and nontender. No distention. No abdominal bruits. No  CVA tenderness. Musculoskeletal: No lower extremity tenderness nor edema.  No joint effusions. Mild tenderness to palpation over the left sacroiliac joint. No saddle anesthesia. 5 out of 5 strength to bilateral lower extremities. Bilateral and equal dorsalis pedis pulses. Neurologic:  Normal speech and language. No gross focal neurologic deficits are appreciated. No gait instability. Skin:  Skin is warm, dry and intact. No rash noted. Psychiatric: Mood and affect are normal. Speech and behavior are normal.  ____________________________________________   LABS (all labs ordered are listed, but only abnormal results are displayed)  Labs Reviewed  URINALYSIS COMPLETEWITH MICROSCOPIC ([facility] ONLY) - Abnormal; Notable for the following:    Color, Urine YELLOW (*)    APPearance CLOUDY (*)    Bacteria, UA RARE (*)    Squamous Epithelial / LPF 6-30 (*)    All other components within normal limits  CBC WITH DIFFERENTIAL/PLATELET - Abnormal; Notable for the following:    Platelets 135 (*)    Neutro Abs 7.2 (*)    Monocytes Absolute 1.0 (*)    All other components within normal limits  COMPREHENSIVE METABOLIC PANEL - Abnormal; Notable for the following:    Potassium 3.2 (*)    AST 13 (*)    ALT 9 (*)    Total Bilirubin 0.2 (*)    All other components within normal limits  LIPASE, BLOOD  PREGNANCY, URINE   ____________________________________________  EKG   ____________________________________________  RADIOLOGY  CAT scan without any acute pathology. No nephrolithiasis or hydroureter. Small growth and adrenal adenoma over the course of the last 7 years. Probable cyst in the midpole left kidney. Normal appendix. ____________________________________________   PROCEDURES   ____________________________________________   INITIAL IMPRESSION / ASSESSMENT AND PLAN / ED COURSE  Pertinent labs & imaging results that were available during my care of the patient were reviewed by me and  considered in my medical decision making (see chart for details).  ----------------------------------------- 9:00 PM on 05/08/2015 -----------------------------------------  Patient resting comfortably at this time. Labs and urine studies are reassuring. Also without any acute pathology on the CAT scan. Given copy of CAT scan report to the patient to take with her to her follow-up appointment. She has a follow-up appointment scheduled on January 10 and says that she will also be discussing her current condition with her primary care tomorrow morning. At this point I do not see any reason to keep the patient here any longer for further workup. History and physical are consistent with sciatic type pain. Already on NSAIDs as well as Flexeril. Will change to Medrol Dosepak as well as switch her muscle  relaxer to [employer]. I discussed this plan with the patient understands and is willing to comply.  also could be sacroiliitis. ____________________________________________   FINAL CLINICAL IMPRESSION(S) / ED DIAGNOSES  Sciatica.    Myrna Blazer, MD 05/08/15 2101

## 2015-05-08 NOTE — Discharge Instructions (Signed)
°  Sciatica °Sciatica is pain, weakness, numbness, or tingling along your sciatic nerve. The nerve starts in the lower back and runs down the back of each leg. Nerve damage or certain conditions pinch or put pressure on the sciatic nerve. This causes the pain, weakness, and other discomforts of sciatica. °HOME CARE  °· Only take medicine as told by your doctor. °· Apply ice to the affected area for 20 minutes. Do this 3-4 times a day for the first 48-72 hours. Then try heat in the same way. °· Exercise, stretch, or do your usual activities if these do not make your pain worse. °· Go to physical therapy as told by your doctor. °· Keep all doctor visits as told. °· Do not wear high heels or shoes that are not supportive. °· Get a firm mattress if your mattress is too soft to lessen pain and discomfort. °GET HELP RIGHT AWAY IF:  °· You cannot control when you poop (bowel movement) or pee (urinate). °· You have more weakness in your lower back, lower belly (pelvis), butt (buttocks), or legs. °· You have redness or puffiness (swelling) of your back. °· You have a burning feeling when you pee. °· You have pain that gets worse when you lie down. °· You have pain that wakes you from your sleep. °· Your pain is worse than past pain. °· Your pain lasts longer than 4 weeks. °· You are suddenly losing weight without reason. °MAKE SURE YOU:  °· Understand these instructions. °· Will watch this condition. °· Will get help right away if you are not doing well or get worse. °  °This information is not intended to replace advice given to you by your health care provider. Make sure you discuss any questions you have with your health care provider. °  °Document Released: 01/29/2008 Document Revised: 01/10/2015 Document Reviewed: 08/31/2011 °Elsevier Interactive Patient Education ©2016 [employer] ° ° °

## 2016-01-26 ENCOUNTER — Emergency Department
Admission: EM | Admit: 2016-01-26 | Discharge: 2016-01-26 | Disposition: A | Discharge status: Home / Self Care | Payer: Self-pay | Attending: Emergency Medicine | Admitting: Emergency Medicine

## 2016-01-26 ENCOUNTER — Emergency Department: Payer: Self-pay

## 2016-01-26 ENCOUNTER — Encounter: Payer: Self-pay | Admitting: Emergency Medicine

## 2016-01-26 DIAGNOSIS — Z79899 Other long term (current) drug therapy: Secondary | ICD-10-CM | POA: Insufficient documentation

## 2016-01-26 DIAGNOSIS — R079 Chest pain, unspecified: Secondary | ICD-10-CM | POA: Insufficient documentation

## 2016-01-26 DIAGNOSIS — F17210 Nicotine dependence, cigarettes, uncomplicated: Secondary | ICD-10-CM | POA: Insufficient documentation

## 2016-01-26 LAB — CBC
HCT: 44.5 % (ref 35.0–47.0)
Hemoglobin: 15.3 g/dL (ref 12.0–16.0)
MCH: 33.9 pg (ref 26.0–34.0)
MCHC: 34.3 g/dL (ref 32.0–36.0)
MCV: 98.7 fL (ref 80.0–100.0)
PLATELETS: 203 10*3/uL (ref 150–440)
RBC: 4.5 MIL/uL (ref 3.80–5.20)
RDW: 13.5 % (ref 11.5–14.5)
WBC: 7 10*3/uL (ref 3.6–11.0)

## 2016-01-26 LAB — BASIC METABOLIC PANEL
Anion gap: 7 (ref 5–15)
BUN: 14 mg/dL (ref 6–20)
CO2: 26 mmol/L (ref 22–32)
CREATININE: 0.59 mg/dL (ref 0.44–1.00)
Calcium: 8.8 mg/dL — ABNORMAL LOW (ref 8.9–10.3)
Chloride: 108 mmol/L (ref 101–111)
GFR calc Af Amer: >60 mL/min (ref >60)
Glucose, Bld: 72 mg/dL (ref 65–99)
POTASSIUM: 3.7 mmol/L (ref 3.5–5.1)
SODIUM: 141 mmol/L (ref 135–145)

## 2016-01-26 LAB — URINALYSIS COMPLETE WITH MICROSCOPIC (ARMC ONLY)
BILIRUBIN URINE: NEGATIVE
Bacteria, UA: NONE SEEN
Glucose, UA: NEGATIVE mg/dL
KETONES UR: NEGATIVE mg/dL
LEUKOCYTES UA: NEGATIVE
NITRITE: NEGATIVE
PH: 6 (ref 5.0–8.0)
Protein, ur: NEGATIVE mg/dL
Specific Gravity, Urine: 1.004 — ABNORMAL LOW (ref 1.005–1.030)

## 2016-01-26 LAB — CBC WITH DIFFERENTIAL/PLATELET
Basophils Absolute: 0.1 10*3/uL (ref 0–0.1)
Basophils Relative: 1 %
EOS PCT: 3 %
Eosinophils Absolute: 0.2 10*3/uL (ref 0–0.7)
HCT: 42.8 % (ref 35.0–47.0)
Hemoglobin: 15 g/dL (ref 12.0–16.0)
LYMPHS ABS: 2.2 10*3/uL (ref 1.0–3.6)
LYMPHS PCT: 30 %
MCH: 34.6 pg — AB (ref 26.0–34.0)
MCHC: 35.1 g/dL (ref 32.0–36.0)
MCV: 98.5 fL (ref 80.0–100.0)
MONO ABS: 0.7 10*3/uL (ref 0.2–0.9)
Monocytes Relative: 9 %
Neutro Abs: 4.1 10*3/uL (ref 1.4–6.5)
Neutrophils Relative %: 57 %
PLATELETS: 191 10*3/uL (ref 150–440)
RBC: 4.35 MIL/uL (ref 3.80–5.20)
RDW: 13.6 % (ref 11.5–14.5)
WBC: 7.1 10*3/uL (ref 3.6–11.0)

## 2016-01-26 LAB — HEPATIC FUNCTION PANEL
ALK PHOS: 53 U/L (ref 38–126)
ALT: 15 U/L (ref 14–54)
AST: 20 U/L (ref 15–41)
Albumin: 3.7 g/dL (ref 3.5–5.0)
BILIRUBIN TOTAL: 0.4 mg/dL (ref 0.3–1.2)
Total Protein: 7.1 g/dL (ref 6.5–8.1)

## 2016-01-26 LAB — PREGNANCY, URINE: Preg Test, Ur: NEGATIVE

## 2016-01-26 LAB — GLUCOSE, CAPILLARY: Glucose-Capillary: 77 mg/dL (ref 65–99)

## 2016-01-26 LAB — TROPONIN I: Troponin I: <0.03 ng/mL (ref <0.03)

## 2016-01-26 NOTE — Discharge Instructions (Signed)
Please call Dr. Duke Salviaandolph the cardiologist and arrange follow-up to check on your chest pain further. Please also follow-up with your regular doctor. Please return if you feel worse. All the tests that we've done today are essentially normal.

## 2016-01-26 NOTE — ED Provider Notes (Signed)
[facility] Emergency Department Provider Note   ____________________________________________   First MD Initiated Contact with Patient 01/26/16 [phone]     (approximate)  I have reviewed the triage vital signs and the nursing notes.   HISTORY  Chief Complaint Dizziness    HPI Hannah Carey is a 45 y.o. female who reports she's been kind of feeling "strange" for the last few days. Possibly even the last few weeks. She had a little bit of a headache like sinus infection and blurry vision yesterday today she got up at 3:00 with her right arm aching and then later on her chest felt funny perhaps a little bit tight she's been feeling lightheaded and weak all over. Her head has not been feeling right for the last few days either. She cannot explain it any better than that. Nothing seems to bring it on or make it worse. She thought maybe her arm was aching because she does production work making socks. She uses her arm a lot.Patient also reports she's a little short of breath with exertion. This is been especially prevalent in the last week again with exertion. Patient occasionally has some chest tightness with exertion but not always.  Past Medical History:  Diagnosis Date  . Depression   . Hemorrhoids     There are no active problems to display for this patient.   History reviewed. No pertinent surgical history.  Prior to Admission medications   Medication Sig Start Date End Date Taking? Authorizing Provider  Aspirin-Salicylamide-Caffeine (BC HEADACHE POWDER PO) Take 1 packet by mouth as needed.    Historical Provider, MD  carisoprodol (SOMA) 350 MG tablet Take 1 tablet (350 mg total) by mouth 3 (three) times daily as needed for muscle spasms. 05/08/15   Myrna Blazer, MD  cyclobenzaprine (FLEXERIL) 5 MG tablet Take 1 tablet (5 mg total) by mouth every 8 (eight) hours as needed for muscle spasms. 04/29/15   Evangeline Dakin, PA-C    HYDROcodone-acetaminophen (NORCO/VICODIN) 5-325 MG tablet Take 1 tablet by mouth every 4 (four) hours as needed. 05/06/15   Chinita Pester, FNP  meloxicam (MOBIC) 15 MG tablet Take 1 tablet (15 mg total) by mouth daily. 05/06/15   Chinita Pester, FNP  methylPREDNISolone (MEDROL) 4 MG tablet 14 day dosepack.  Follow directions on packaging. 05/08/15   Myrna Blazer, MD  omeprazole (PRILOSEC) 20 MG capsule Take 20 mg by mouth daily.    Historical Provider, MD  ondansetron (ZOFRAN-ODT) 4 MG disintegrating tablet Take 1 tablet (4 mg total) by mouth every 8 (eight) hours as needed for nausea or vomiting. 04/18/15   Rockne Menghini, MD  sertraline (ZOLOFT) 100 MG tablet Take 100 mg by mouth daily.    Historical Provider, MD    Allergies Review of patient's allergies indicates no known allergies.  No family history on file.  Social History Social History  Substance Use Topics  . Smoking status: Current Every Day Smoker    Packs/day: 1.00    Types: Cigarettes  . Smokeless tobacco: Never Used  . Alcohol use No    Review of Systems Constitutional: No fever/chills Eyes: No visual changesAt present vision was blurry yesterday. ENT: No sore throat. Cardiovascular: See history of present illness Respiratory: Denies shortness of breath at present. Gastrointestinal: No abdominal pain.  No nausea, no vomiting.  No diarrhea.  No constipation. Genitourinary: Negative for dysuria. Musculoskeletal: Negative for back pain. Skin: Negative for rash. Neurological: Negative for headaches, focal weakness  or numbness.  10-point ROS otherwise negative.  ____________________________________________   PHYSICAL EXAM:  VITAL SIGNS: ED Triage Vitals  Enc Vitals Group     BP 01/26/16 0720 (!) 189/88     Pulse Rate 01/26/16 0720 83     Resp 01/26/16 0720 15     Temp 01/26/16 0720 98.4 F (36.9 C)     Temp Source 01/26/16 0720 Oral     SpO2 01/26/16 0720 99 %     Weight 01/26/16 0721 120  lb (54.4 kg)     Height 01/26/16 0721 5\' 3"  (1.6 m)     Head Circumference --      Peak Flow --      Pain Score --      Pain Loc --      Pain Edu? --      Excl. in GC? --     Constitutional: Alert and oriented. Well appearing and in no acute distress. Eyes: Conjunctivae are normal. PERRL. EOMI. Head: Atraumatic. Nose: No congestion/rhinnorhea. Mouth/Throat: Mucous membranes are moist.  Oropharynx non-erythematous. Neck: No stridor.  Cardiovascular: Normal rate, regular rhythm. Grossly normal heart sounds.  Good peripheral circulation. Respiratory: Normal respiratory effort.  No retractions. Lungs CTAB. Gastrointestinal: Soft and nontender. No distention. No abdominal bruits. No CVA tenderness. }Musculoskeletal: No lower extremity tenderness nor edema.  No joint effusions. Neurologic:  Normal speech and language. No gross focal neurologic deficits are appreciated. Cranial nerves II through XII are intact cerebellar rapid alternating movements in the hands and finger to nose are normal motor strength is 5 over 5 throughout sensation is intact throughout No gait instability. Skin:  Skin is warm, dry and intact. No rash noted. Psychiatric: Mood and affect are normal. Speech and behavior are normal.  ____________________________________________   LABS (all labs ordered are listed, but only abnormal results are displayed)  Labs Reviewed  BASIC METABOLIC PANEL - Abnormal; Notable for the following:       Result Value   Calcium 8.8 (*)    All other components within normal limits  URINALYSIS COMPLETEWITH MICROSCOPIC ([facility] ONLY) - Abnormal; Notable for the following:    Color, Urine STRAW (*)    APPearance CLEAR (*)    Specific Gravity, Urine 1.004 (*)    Hgb urine dipstick 1+ (*)    Squamous Epithelial / LPF 0-5 (*)    All other components within normal limits  CBC WITH DIFFERENTIAL/PLATELET - Abnormal; Notable for the following:    MCH 34.6 (*)    All other components within  normal limits  HEPATIC FUNCTION PANEL - Abnormal; Notable for the following:    Bilirubin, Direct <0.1 (*)    All other components within normal limits  CBC  GLUCOSE, CAPILLARY  TROPONIN I  PREGNANCY, URINE  CBG MONITORING, ED   ____________________________________________  EKG EKG read and interpreted by me shows normal sinus rhythm rate of 93 normal axis no acute ST-T wave changes ____________________________________________  RADIOLOGY  CLINICAL DATA:  Night sweats  EXAM: CT HEAD WITHOUT CONTRAST  TECHNIQUE: Contiguous axial images were obtained from the base of the skull through the vertex without intravenous contrast.  COMPARISON:  None.  FINDINGS: Brain: No mass effect, midline shift, or acute hemorrhage. No mass effect, midline shift, or acute hemorrhage.  Vascular: Unremarkable.  Skull: Cranium is intact.  Sinuses/Orbits: Mastoid air cells are clear. Minimal mucus in the left posterior ethmoid air cell.  Other: Noncontributory.  IMPRESSION: No acute intracranial pathology.   Electronically Signed   By:  Jolaine ClickArthur  Hoss M.D.   On: 01/26/2016 10:55 __________CLINICAL DATA:  Chest pain for the past few days. Smoker. No known heart/lung disease  EXAM: PORTABLE CHEST 1 VIEW  COMPARISON:  10/12/2012  FINDINGS: Midline trachea. Borderline cardiomegaly. Mediastinal contours otherwise within normal limits. No pleural effusion or pneumothorax. Clear lungs.  IMPRESSION: Borderline cardiomegaly, without acute disease.   Electronically Signed   By: Jeronimo GreavesKyle  Talbot M.D.   On: 01/26/2016 09:31 __________________________________   PROCEDURES  Procedure(s) performed:   Procedures  Critical Care performed:   ____________________________________________   INITIAL IMPRESSION / ASSESSMENT AND PLAN / ED COURSE  Pertinent labs & imaging results that were available during my care of the patient were reviewed by me and considered in my  medical decision making (see chart for details).  CT chest x-ray troponin and EKG lab work is all essentially within normal limits M uncertain as to the etiology of her diffuse aches and pains weakness and dizziness. I will have her follow-up with her regular doctor and because of the chest pain and occasional shortness of breath with exertion and also the cardiologist. I will only do one troponin since the chest pain has been intermittent for several days.  Clinical Course     ____________________________________________   FINAL CLINICAL IMPRESSION(S) / ED DIAGNOSES  Final diagnoses:  Chest pain, unspecified chest pain type      NEW MEDICATIONS STARTED DURING THIS VISIT:  New Prescriptions   No medications on file     Note:  This document was prepared using Dragon voice recognition software and may include unintentional dictation errors.    Arnaldo NatalPaul F Malinda, MD 01/26/16 [phone]

## 2016-01-26 NOTE — ED Triage Notes (Signed)
Pt reports being light headed, having night sweats and states that her head does not feel right.  NAD, skin warm and dry.

## 2016-01-28 ENCOUNTER — Encounter: Payer: Self-pay | Admitting: Cardiology

## 2016-01-28 ENCOUNTER — Encounter: Payer: Self-pay | Admitting: *Deleted

## 2016-01-28 ENCOUNTER — Ambulatory Visit (INDEPENDENT_AMBULATORY_CARE_PROVIDER_SITE_OTHER): Payer: Self-pay | Admitting: Cardiology

## 2016-01-28 VITALS — BP 150/100 | HR 86 | Ht 63.0 in | Wt 129.4 lb

## 2016-01-28 DIAGNOSIS — R079 Chest pain, unspecified: Secondary | ICD-10-CM

## 2016-01-28 DIAGNOSIS — Z7689 Persons encountering health services in other specified circumstances: Secondary | ICD-10-CM

## 2016-01-28 DIAGNOSIS — I10 Essential (primary) hypertension: Secondary | ICD-10-CM

## 2016-01-28 DIAGNOSIS — Z7189 Other specified counseling: Secondary | ICD-10-CM

## 2016-01-28 MED ORDER — AMLODIPINE BESYLATE 5 MG PO TABS: 5.0000 mg | ORAL_TABLET | Freq: Every day | ORAL | 3 refills | Status: DC

## 2016-01-28 NOTE — Patient Instructions (Addendum)
Medication Instructions:  Your physician has recommended you make the following change in your medication:  1. Amlodipine 5 mg Once Daily 2. STOP taking BC powders  Make sure to follow up with your primary care provider for sinus issues.   Your physician has requested that you have an echocardiogram. Echocardiography is a painless test that uses sound waves to create images of your heart. It provides your doctor with information about the size and shape of your heart and how well your heart's chambers and valves are working. This procedure takes approximately one hour. There are no restrictions for this procedure.     Follow-Up: Your physician recommends that you schedule a follow-up appointment in: 2 weeks with Dr. Alvino ChapelIngal.  It was a pleasure seeing you today here in the office. Please do not hesitate to give us a call back if you have any further questions. [phone]  Red Jacket CellarPamela A. RN, BSN

## 2016-01-28 NOTE — Progress Notes (Signed)
Cardiology Office Note   Date:  01/29/2016   ID:  Hannah AoLorie A Salberg, DOB 12/02/1970, MRN [id number]  [facility]  [facility]   Cardiologist:   Almond LintAileen Branon Sabine, MD   Reason for consultation:  Chief Complaint  Patient presents with  . Establish Care    ED f/u   CP    History of Present Illness: Hannah Carey is a 45 y.o. female who presents forEvaluation of chest pain.  Recently she presented to the ER with episode of dizziness and blurring of vision and chest discomfort. Chest discomfort was substernal, mild intensity, lasting minutes at a time, nonradiating, resolving spontaneously. New onset. ER record her blood pressure 189/88. She has no prior history of hypertension. Patient reports history of headache times one week prior to ER visit.  Patient denies PND, orthopnea, edema. No abdominal pain. No syncope. No palpitations.    ROS:  Please see the history of present illness. Aside from mentioned under HPI, all other systems are reviewed and negative.     Past Medical History:  Diagnosis Date  . Depression   . Hemorrhoids     History reviewed. No pertinent surgical history.   reports that she has been smoking Cigarettes.  She has been smoking about 1.00 pack per day. She has never used smokeless tobacco. She reports that she does not drink alcohol or use drugs.   family history is not on file.   Outpatient Medications Prior to Visit  Medication Sig Dispense Refill  . Aspirin-Salicylamide-Caffeine (BC HEADACHE POWDER PO) Take 1 packet by mouth as needed.    Marland Kitchen. omeprazole (PRILOSEC) 20 MG capsule Take 20 mg by mouth daily.    . carisoprodol (SOMA) 350 MG tablet Take 1 tablet (350 mg total) by mouth 3 (three) times daily as needed for muscle spasms. 15 tablet 0  . cyclobenzaprine (FLEXERIL) 5 MG tablet Take 1 tablet (5 mg total) by mouth every 8 (eight) hours as needed for muscle spasms. 30 tablet 0  . HYDROcodone-acetaminophen (NORCO/VICODIN) 5-325  MG tablet Take 1 tablet by mouth every 4 (four) hours as needed. 12 tablet 0  . meloxicam (MOBIC) 15 MG tablet Take 1 tablet (15 mg total) by mouth daily. 30 tablet 0  . methylPREDNISolone (MEDROL) 4 MG tablet 14 day dosepack.  Follow directions on packaging. 21 tablet 0  . ondansetron (ZOFRAN-ODT) 4 MG disintegrating tablet Take 1 tablet (4 mg total) by mouth every 8 (eight) hours as needed for nausea or vomiting. 20 tablet 0  . sertraline (ZOLOFT) 100 MG tablet Take 100 mg by mouth daily.     No facility-administered medications prior to visit.      Allergies: Review of patient's allergies indicates no known allergies.    PHYSICAL EXAM: VS:  BP (!) 150/100 (BP Location: Right Arm, Patient Position: Sitting, Cuff Size: Normal)   Pulse 86   Ht 5\' 3"  (1.6 m)   Wt 129 lb 6.4 oz (58.7 kg)   SpO2 98%   BMI 22.92 kg/m  , Body mass index is 22.92 kg/m. Wt Readings from Last 3 Encounters:  01/28/16 129 lb 6.4 oz (58.7 kg)  01/26/16 120 lb (54.4 kg)  05/08/15 132 lb (59.9 kg)    GENERAL:  well developed, well nourished, not in acute distress HEENT: normocephalic, pink conjunctivae, anicteric sclerae, no xanthelasma, normal dentition, oropharynx clear NECK:  no neck vein engorgement, JVP normal, no hepatojugular reflux, carotid upstroke brisk and symmetric, no bruit, no thyromegaly, no  lymphadenopathy LUNGS:  good respiratory effort, clear to auscultation bilaterally CV:  PMI not displaced, no thrills, no lifts, S1 and S2 within normal limits, no palpable S3 or S4, no murmurs, no rubs, no gallops ABD:  Soft, nontender, nondistended, normoactive bowel sounds, no abdominal aortic bruit, no hepatomegaly, no splenomegaly MS: nontender back, no kyphosis, no scoliosis, no joint deformities EXT:  2+ DP/PT pulses, no edema, no varicosities, no cyanosis, no clubbing SKIN: warm, nondiaphoretic, normal turgor, no ulcers NEUROPSYCH: alert, oriented to person, place, and time, sensory/motor grossly  intact, normal mood, appropriate affect  Recent Labs: 01/26/2016: ALT 15; BUN 14; Creatinine, Ser 0.59; Hemoglobin 15.3; Hemoglobin 15.0; Platelets 203; Platelets 191; Potassium 3.7; Sodium 141   Lipid Panel No results found for: CHOL, TRIG, HDL, CHOLHDL, VLDL, LDLCALC, LDLDIRECT   Other studies Reviewed:  EKG:  The ekg from 01/26/2016 was personally reviewed by me and it revealed sinus rhythm, 93 BPM. L AE.  Additional studies/ records that were reviewed personally reviewed by me today include: None available   ASSESSMENT AND PLAN:  Elevated blood pressure Episode of chest discomfort, dizziness, blurred vision - may be related to elevated blood pressure Patient likely has diagnoses of hypertension. Recommend to start amlodipine 5 mg by mouth daily. Monitor blood pressure. Goal is less than 140/80. Recommend echocardiogram.  If symptoms of chest discomfort persist despite controlling blood pressure, will come in to do ischemia evaluation at that time.  Tobacco use We discussed the importance of smoking cessation and different strategies for quitting.    Current medicines are reviewed at length with the patient today.  The patient does not have concerns regarding medicines.  Labs/ tests ordered today include:  Orders Placed This Encounter  Procedures  . ECHOCARDIOGRAM COMPLETE    I had a lengthy and detailed discussion with the patient regarding diagnoses, prognosis, diagnostic options, treatment options, and side effects of medications.   I counseled the patient on importance of lifestyle modification including heart healthy diet, regular physical activity Once cardiac workup is completed , and smoking cessation.   Disposition:   FU with undersigned in 2 weeks with blood pressure log  Signed, Almond Lint, MD  01/29/2016 9:51 AM    [facility]  This note was generated in part with voice recognition software and I apologize for any typographical  errors that were not detected and corrected.

## 2016-02-04 ENCOUNTER — Other Ambulatory Visit: Payer: Self-pay

## 2016-02-06 ENCOUNTER — Encounter: Payer: Self-pay | Admitting: Cardiology

## 2016-02-07 ENCOUNTER — Ambulatory Visit (INDEPENDENT_AMBULATORY_CARE_PROVIDER_SITE_OTHER): Payer: Self-pay

## 2016-02-07 ENCOUNTER — Other Ambulatory Visit: Payer: Self-pay

## 2016-02-07 DIAGNOSIS — R079 Chest pain, unspecified: Secondary | ICD-10-CM

## 2016-02-11 ENCOUNTER — Encounter: Payer: Self-pay | Admitting: Cardiology

## 2016-02-11 NOTE — Telephone Encounter (Signed)
Called and spoke with patient at length about her symptoms. She states that she has not been getting better. She is having headaches where it feels like it is behind her eyes, her feet feel weird, legs/knees feel weak. She has been on antibiotics for sinus infection and has not gotten better. She reports that they have put her on 2 different antibiotics after going for 2 visits. She also reported to have some nausea as well. Instructed her to monitor her blood pressures, she states that her most recent blood pressure was 100/72. She was concerned about not getting better. Let her know to follow up with her primary care physician because her symptoms sound related to the sinus infection. Instructed her to keep hydrated and to try and eat something with her medications to try and reduce any nausea. She verbalized understanding and had no further questions at this time.

## 2016-02-19 ENCOUNTER — Encounter: Payer: Self-pay | Admitting: Cardiology

## 2016-02-19 ENCOUNTER — Ambulatory Visit (INDEPENDENT_AMBULATORY_CARE_PROVIDER_SITE_OTHER): Payer: Self-pay | Admitting: Cardiology

## 2016-02-19 VITALS — BP 120/90 | HR 98 | Ht 63.0 in | Wt 122.1 lb

## 2016-02-19 DIAGNOSIS — I351 Nonrheumatic aortic (valve) insufficiency: Secondary | ICD-10-CM

## 2016-02-19 DIAGNOSIS — I10 Essential (primary) hypertension: Secondary | ICD-10-CM

## 2016-02-19 DIAGNOSIS — R002 Palpitations: Secondary | ICD-10-CM

## 2016-02-19 NOTE — Progress Notes (Signed)
Cardiology Office Note   Date:  02/20/2016   ID:  Hannah AoLorie A Mccamy, DOB 09/15/1970, MRN [id number]  [facility]:  [facility]   Cardiologist:   Almond LintAileen Alacia Rehmann, MD   Reason for consultation:  Chief Complaint  Patient presents with  . Follow-up    NO COMPLAINTS.     History of Present Illness: Hannah Carey is a 45 y.o. female who presents for Follow-up after tests.  Since last visit, no recurrence of chest discomfort.  Her blood pressures have improved with initiation of medication.  Patient reports palpitations. Symptoms mainly in the chest, nonradiating, fluttering sensation, mild in intensity, lasting minutes at a time, no known precipitating or palliating factors.  Patient denies shortness of breath, PND, orthopnea, edema. No abdominal pain. No syncope. No palpitations.   ROS:  Please see the history of present illness. Aside from mentioned under HPI, all other systems are reviewed and negative.     Past Medical History:  Diagnosis Date  . Depression   . Hemorrhoids     No past surgical history on file.   reports that she has been smoking Cigarettes.  She has been smoking about 1.00 pack per day. She has never used smokeless tobacco. She reports that she does not drink alcohol or use drugs.   family history is not on file.   Outpatient Medications Prior to Visit  Medication Sig Dispense Refill  . amLODipine (NORVASC) 5 MG tablet Take 1 tablet (5 mg total) by mouth daily. 30 tablet 3  . Aspirin-Salicylamide-Caffeine (BC HEADACHE POWDER PO) Take 1 packet by mouth as needed.    Marland Kitchen. omeprazole (PRILOSEC) 20 MG capsule Take 20 mg by mouth daily.    . sertraline (ZOLOFT) 100 MG tablet Take 150 mg by mouth daily.     No facility-administered medications prior to visit.      Allergies: Review of patient's allergies indicates no known allergies.    PHYSICAL EXAM: VS:  BP 120/90 (BP Location: Left Arm, Patient Position: Sitting, Cuff Size:  Normal)   Pulse 98   Ht 5\' 3"  (1.6 m)   Wt 122 lb 1.9 oz (55.4 kg)   BMI 21.63 kg/m  , Body mass index is 21.63 kg/m. Wt Readings from Last 3 Encounters:  02/19/16 122 lb 1.9 oz (55.4 kg)  01/28/16 129 lb 6.4 oz (58.7 kg)  01/26/16 120 lb (54.4 kg)    GENERAL:  well developed, well nourished, not in acute distress HEENT: normocephalic, pink conjunctivae, anicteric sclerae, no xanthelasma, normal dentition, oropharynx clear NECK:  no neck vein engorgement, JVP normal, no hepatojugular reflux, carotid upstroke brisk and symmetric, no bruit, no thyromegaly, no lymphadenopathy LUNGS:  good respiratory effort, clear to auscultation bilaterally CV:  PMI not displaced, no thrills, no lifts, S1 and S2 within normal limits, no palpable S3 or S4, no murmurs, no rubs, no gallops ABD:  Soft, nontender, nondistended, normoactive bowel sounds, no abdominal aortic bruit, no hepatomegaly, no splenomegaly MS: nontender back, no kyphosis, no scoliosis, no joint deformities EXT:  2+ DP/PT pulses, no edema, no varicosities, no cyanosis, no clubbing SKIN: warm, nondiaphoretic, normal turgor, no ulcers NEUROPSYCH: alert, oriented to person, place, and time, sensory/motor grossly intact, normal mood, appropriate affect  Recent Labs: 01/26/2016: ALT 15; BUN 14; Creatinine, Ser 0.59; Hemoglobin 15.3; Hemoglobin 15.0; Platelets 203; Platelets 191; Potassium 3.7; Sodium 141   Lipid Panel No results found for: CHOL, TRIG, HDL, CHOLHDL, VLDL, LDLCALC, LDLDIRECT   Other studies Reviewed:  EKG:  The ekg from 01/26/2016 was personally reviewed by me and it revealed sinus rhythm, 93 BPM. L AE.  Additional studies/ records that were reviewed personally reviewed by me today include:  Echo 02/07/2016: Left ventricle: The cavity size was normal. Systolic function was   normal. The estimated ejection fraction was in the range of 60%   to 65%. Wall motion was normal; there were no regional wall   motion  abnormalities. Left ventricular diastolic function   parameters were normal. - Aortic valve: There was trivial regurgitation. - Left atrium: The atrium was normal in size. - Right ventricle: Systolic function was normal. - Pulmonary arteries: Systolic pressure was within the normal   range.  Impressions:  - Normal study.   ASSESSMENT AND PLAN:  Elevated blood pressure/hypertension Episode of chest discomfort, dizziness, blurred vision - may be related to elevated blood pressure --- no recurrence since controlled blood pressure Continue amlodipine 5 mg by mouth daily. Monitor blood pressure. Goal is less than 140/80. LVEF within normal limits and echo.  Trivial  Aortic regurgitation Serial evaluation recommended, likely repeat echo in one year  Palpitations Recommend 24-hour Holter monitor  Tobacco use We discussed the importance of smoking cessation and different strategies for quitting.    Current medicines are reviewed at length with the patient today.  The patient does not have concerns regarding medicines.  Labs/ tests ordered today include:  Orders Placed This Encounter  Procedures  . Holter monitor - 24 hour    I had a lengthy and detailed discussion with the patient regarding diagnoses, prognosis, diagnostic options, treatment options, and side effects of medications.   I counseled the patient on importance of lifestyle modification including heart healthy diet, regular physical activity , and smoking cessation.   Disposition:   FU with After tests  Signed, Almond Lint, MD  02/20/2016 8:45 AM    [facility]  This note was generated in part with voice recognition software and I apologize for any typographical errors that were not detected and corrected.

## 2016-02-19 NOTE — Patient Instructions (Addendum)
Testing/Procedures: Your physician has recommended that you wear a holter monitor. Holter monitors are medical devices that record the heart's electrical activity. Doctors most often use these monitors to diagnose arrhythmias. Arrhythmias are problems with the speed or rhythm of the heartbeat. The monitor is a small, portable device. You can wear one while you do your normal daily activities. This is usually used to diagnose what is causing palpitations/syncope (passing out).    Follow-Up: Your physician recommends that you schedule a follow-up appointment as needed with Dr. Alvino ChapelIngal. We will call you with results of holter monitor.   It was a pleasure seeing you today here in the office. Please do not hesitate to give us a call back if you have any further questions. [phone]  [place] CellarPamela A. RN, BSN      Holter Monitoring A Holter monitor is a small device that is used to detect abnormal heart rhythms. It clips to your clothing and is connected by wires to flat, sticky disks (electrodes) that attach to your chest. It is worn continuously for 24-48 hours. HOME CARE INSTRUCTIONS  Wear your Holter monitor at all times, even while exercising and sleeping, for as long as directed by your health care provider.  Make sure that the Holter monitor is safely clipped to your clothing or close to your body as recommended by your health care provider.  Do not get the monitor or wires wet.  Do not put body lotion or moisturizer on your chest.  Keep your skin clean.  Keep a diary of your daily activities, such as walking and doing chores. If you feel that your heartbeat is abnormal or that your heart is fluttering or skipping a beat:  Record what you are doing when it happens.  Record what time of day the symptoms occur.  Return your Holter monitor as directed by your health care provider.  Keep all follow-up visits as directed by your health care provider. This is important. SEEK IMMEDIATE  MEDICAL CARE IF:  You feel lightheaded or you faint.  You have trouble breathing.  You feel pain in your chest, upper arm, or jaw.  You feel sick to your stomach and your skin is pale, cool, or damp.  You heartbeat feels unusual or abnormal.   This information is not intended to replace advice given to you by your health care provider. Make sure you discuss any questions you have with your health care provider.   Document Released: 01/18/2004 Document Revised: 05/12/2014 Document Reviewed: 11/28/2013 Elsevier Interactive Patient Education [employer]2016 [employer] Inc.

## 2016-02-20 ENCOUNTER — Telehealth: Payer: Self-pay | Admitting: *Deleted

## 2016-02-20 DIAGNOSIS — I351 Nonrheumatic aortic (valve) insufficiency: Secondary | ICD-10-CM

## 2016-02-20 NOTE — Telephone Encounter (Signed)
Spoke with patient and made her aware that we were ordering an echocardiogram for her to have done in one year. Let her know that we would give her a call closer to that time to get that scheduled. She verbalized understanding and had no further questions at this time.

## 2016-02-20 NOTE — Telephone Encounter (Signed)
-----   Message from Almond LintAileen Ingal, MD sent at 02/20/2016  8:49 AM EDT ----- Can we please order an echo in 1 year for trivial aortic regurgitation? Thank you.

## 2016-02-27 ENCOUNTER — Ambulatory Visit (INDEPENDENT_AMBULATORY_CARE_PROVIDER_SITE_OTHER): Payer: Self-pay

## 2016-02-27 DIAGNOSIS — R002 Palpitations: Secondary | ICD-10-CM

## 2016-03-05 ENCOUNTER — Ambulatory Visit
Admission: RE | Admit: 2016-03-05 | Discharge: 2016-03-05 | Disposition: A | Discharge status: Home / Self Care | Payer: Self-pay | Source: Ambulatory Visit | Attending: Cardiology | Admitting: Cardiology

## 2016-03-05 DIAGNOSIS — R002 Palpitations: Secondary | ICD-10-CM | POA: Insufficient documentation

## 2016-04-06 ENCOUNTER — Emergency Department
Admission: EM | Admit: 2016-04-06 | Discharge: 2016-04-06 | Disposition: A | Discharge status: Home / Self Care | Payer: Self-pay | Attending: Emergency Medicine | Admitting: Emergency Medicine

## 2016-04-06 ENCOUNTER — Emergency Department: Payer: Self-pay

## 2016-04-06 DIAGNOSIS — M65331 Trigger finger, right middle finger: Secondary | ICD-10-CM | POA: Insufficient documentation

## 2016-04-06 DIAGNOSIS — I10 Essential (primary) hypertension: Secondary | ICD-10-CM | POA: Insufficient documentation

## 2016-04-06 DIAGNOSIS — F17210 Nicotine dependence, cigarettes, uncomplicated: Secondary | ICD-10-CM | POA: Insufficient documentation

## 2016-04-06 DIAGNOSIS — Z79899 Other long term (current) drug therapy: Secondary | ICD-10-CM | POA: Insufficient documentation

## 2016-04-06 HISTORY — DX: Essential (primary) hypertension: I10

## 2016-04-06 MED ORDER — MELOXICAM 15 MG PO TABS: 15.0000 mg | ORAL_TABLET | Freq: Every day | ORAL | 0 refills | Status: DC

## 2016-04-06 NOTE — ED Notes (Signed)
See triage note  Developed pain to right hand about 2-3 weeks ago  Unsure of injury  Pain is mainly at 3 digit  Min swelling noted on arrival

## 2016-04-06 NOTE — ED Provider Notes (Signed)
[facility] Emergency Department Provider Note ____________________________________________  Time seen: Approximately 10:32 AM  I have reviewed the triage vital signs and the nursing notes.   HISTORY  Chief Complaint Hand Pain    HPI Hannah Carey is a 45 y.o. female who presents to the emergency department for evaluation of right hand pain. She states pain started about 3 weeks ago. Her PCP started her on diclofenac which has helped a little, but pain continues. 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 She denies a specific injury.  Past Medical History:  Diagnosis Date  . Depression   . Hemorrhoids   . Hypertension     There are no active problems to display for this patient.   History reviewed. No pertinent surgical history.  Prior to Admission medications   Medication Sig Start Date End Date Taking? Authorizing Provider  busPIRone (BUSPAR) 10 MG tablet Take 10 mg by mouth 3 (three) times daily.   Yes Historical Provider, MD  cetirizine (ZYRTEC) 10 MG tablet Take 10 mg by mouth daily.   Yes Historical Provider, MD  diclofenac (VOLTAREN) 50 MG EC tablet Take 50 mg by mouth 2 (two) times daily.   Yes Historical Provider, MD  amLODipine (NORVASC) 5 MG tablet Take 1 tablet (5 mg total) by mouth daily. 01/28/16 04/27/16  Almond LintAileen Ingal, MD  Aspirin-Salicylamide-Caffeine (BC HEADACHE POWDER PO) Take 1 packet by mouth as needed.    Historical Provider, MD  meloxicam (MOBIC) 15 MG tablet Take 1 tablet (15 mg total) by mouth daily. 04/06/16   Chinita Pesterari B Xan Sparkman, FNP  omeprazole (PRILOSEC) 20 MG capsule Take 20 mg by mouth daily.    Historical Provider, MD  sertraline (ZOLOFT) 100 MG tablet Take 150 mg by mouth daily.    Historical Provider, MD    Allergies Patient has no known allergies.  No family history on file.  Social History Social History  Substance Use Topics  . Smoking status: Current Every Day Smoker    Packs/day: 1.00    Types: Cigarettes  . Smokeless tobacco: Never Used  . Alcohol use No    Review of Systems Constitutional: No recent illness. Cardiovascular: Denies chest pain or palpitations. Respiratory: Denies shortness of breath. Musculoskeletal: Pain in right hand, specifically third MCP with radiation into the palm Skin: Negative for rash, wound, lesion. Neurological: Negative for focal weakness or numbness.  ____________________________________________   PHYSICAL EXAM:  VITAL SIGNS: ED Triage Vitals [04/06/16 0953]  Enc Vitals Group     BP (!) 146/90     Pulse Rate 70     Resp 18     Temp 98.3 F (36.8 C)     Temp Source Oral     SpO2 100 %     Weight 122 lb (55.3 kg)     Height 5\' 4"  (1.626 m)     Head Circumference      Peak Flow      Pain Score 7     Pain Loc      Pain Edu?      Excl. in GC?     Constitutional: Alert and oriented. Well appearing and in no acute distress. Eyes: Conjunctivae are normal. EOMI. Head: Atraumatic. Neck: No stridor.  Respiratory: Normal respiratory effort.   Musculoskeletal: Full range of motion of the right hand, however there is a significant click and delay with extension of the third digit after making a fist. She has  a palpable, firm nodule on the palmar aspect at the 3rd MCP. Neurologic:  Normal speech and language. No gross focal neurologic deficits are appreciated. Speech is normal. No gait instability. Skin:  Skin is warm, dry and intact. Atraumatic. Psychiatric: Mood and affect are normal. Speech and behavior are normal.  ____________________________________________   LABS (all labs ordered are listed, but only abnormal results are displayed)  Labs Reviewed - No data to display ____________________________________________  RADIOLOGY  Negative for acute bony abnormality per radiology. ____________________________________________   PROCEDURES  Procedure(s) performed: Aluminum foam splint applied to these  right hand, middle finger. Splint bent into functional position.   ____________________________________________   INITIAL IMPRESSION / ASSESSMENT AND PLAN / ED COURSE  Clinical Course     Pertinent labs & imaging results that were available during my care of the patient were reviewed by me and considered in my medical decision making (see chart for details).  Patient was advised to see the hand specialist that number or so. She was advised to wear the splint as much as possible, but was advised to remove it for showering. She was advised to continue taking the diclofenac daily as prescribed. She was advised to return to the emergency department for symptoms that change or worsen if she is unable to schedule an appointment. ____________________________________________   FINAL CLINICAL IMPRESSION(S) / ED DIAGNOSES  Final diagnoses:  Trigger middle finger of right hand       Chinita PesterCari B Kamdin Follett, FNP 04/06/16 1729    Jene Everyobert Kinner, MD 04/07/16 1705

## 2016-04-06 NOTE — ED Triage Notes (Signed)
Right hand pain X 3 weeks, no known injury. Saw PCP and started on antiinflammatory per patient. No bruising, swelling present at this time. Pain worse with movement.

## 2016-04-06 NOTE — Discharge Instructions (Signed)
Keep the finger splinted until you follow up with orthopedics. Return to the ER for symptoms that change or worsen if you are unable to schedule an appointment.

## 2016-05-13 ENCOUNTER — Emergency Department: Payer: BLUE CROSS/BLUE SHIELD

## 2016-05-13 ENCOUNTER — Encounter: Payer: Self-pay | Admitting: Emergency Medicine

## 2016-05-13 ENCOUNTER — Emergency Department
Admission: EM | Admit: 2016-05-13 | Discharge: 2016-05-13 | Disposition: A | Discharge status: Home / Self Care | Payer: BLUE CROSS/BLUE SHIELD | Attending: Emergency Medicine | Admitting: Emergency Medicine

## 2016-05-13 DIAGNOSIS — F17210 Nicotine dependence, cigarettes, uncomplicated: Secondary | ICD-10-CM | POA: Diagnosis not present

## 2016-05-13 DIAGNOSIS — G8929 Other chronic pain: Secondary | ICD-10-CM | POA: Insufficient documentation

## 2016-05-13 DIAGNOSIS — Z79899 Other long term (current) drug therapy: Secondary | ICD-10-CM | POA: Insufficient documentation

## 2016-05-13 DIAGNOSIS — I10 Essential (primary) hypertension: Secondary | ICD-10-CM | POA: Insufficient documentation

## 2016-05-13 DIAGNOSIS — M25512 Pain in left shoulder: Secondary | ICD-10-CM | POA: Diagnosis present

## 2016-05-13 DIAGNOSIS — M5412 Radiculopathy, cervical region: Secondary | ICD-10-CM | POA: Insufficient documentation

## 2016-05-13 LAB — CBC
HCT: 43.6 % (ref 35.0–47.0)
HEMOGLOBIN: 15 g/dL (ref 12.0–16.0)
MCH: 33.9 pg (ref 26.0–34.0)
MCHC: 34.4 g/dL (ref 32.0–36.0)
MCV: 98.6 fL (ref 80.0–100.0)
PLATELETS: 192 10*3/uL (ref 150–440)
RBC: 4.42 MIL/uL (ref 3.80–5.20)
RDW: 13.2 % (ref 11.5–14.5)
WBC: 9.2 10*3/uL (ref 3.6–11.0)

## 2016-05-13 LAB — BASIC METABOLIC PANEL
ANION GAP: 6 (ref 5–15)
BUN: 11 mg/dL (ref 6–20)
CALCIUM: 8.8 mg/dL — AB (ref 8.9–10.3)
CO2: 27 mmol/L (ref 22–32)
CREATININE: 0.61 mg/dL (ref 0.44–1.00)
Chloride: 108 mmol/L (ref 101–111)
Glucose, Bld: 114 mg/dL — ABNORMAL HIGH (ref 65–99)
Potassium: 3.3 mmol/L — ABNORMAL LOW (ref 3.5–5.1)
SODIUM: 141 mmol/L (ref 135–145)

## 2016-05-13 LAB — TROPONIN I

## 2016-05-13 MED ORDER — LIDOCAINE 5 % EX PTCH
1.0000 | MEDICATED_PATCH | CUTANEOUS | Status: DC
  Administered 2016-05-13: 1 via TRANSDERMAL
  Filled 2016-05-13: qty 1

## 2016-05-13 MED ORDER — LIDOCAINE 5 % EX PTCH: 1.0000 | MEDICATED_PATCH | Freq: Two times a day (BID) | CUTANEOUS | 0 refills | Status: AC

## 2016-05-13 MED ORDER — ETODOLAC 200 MG PO CAPS: 200.0000 mg | ORAL_CAPSULE | Freq: Three times a day (TID) | ORAL | 0 refills | Status: DC

## 2016-05-13 MED ORDER — TRAMADOL HCL 50 MG PO TABS: 50.0000 mg | ORAL_TABLET | Freq: Four times a day (QID) | ORAL | 0 refills | Status: DC | PRN

## 2016-05-13 MED ORDER — KETOROLAC TROMETHAMINE 30 MG/ML IJ SOLN
30.0000 mg | Freq: Once | INTRAMUSCULAR | Status: AC
  Administered 2016-05-13: 30 mg via INTRAMUSCULAR

## 2016-05-13 MED ORDER — KETOROLAC TROMETHAMINE 30 MG/ML IJ SOLN
30.0000 mg | Freq: Once | INTRAMUSCULAR | Status: DC
  Filled 2016-05-13: qty 1

## 2016-05-13 NOTE — ED Provider Notes (Signed)
[facility] Emergency Department Provider Note   ____________________________________________   First MD Initiated Contact with Patient 05/13/16 [phone]     (approximate)  I have reviewed the triage vital signs and the nursing notes.   HISTORY  Chief Complaint Shoulder Pain and Arm Numbness    HPI Hannah Carey is a 46 y.o. female who comes into the hospital today with pain in her neck and shoulder on the left. She reports that sometimes the pain goes down into her arm and sometimes she has numbness in her hands. She reports that this is been going on for a while. When asked to clarify she's had been a few weeks. She reports that she's had some problems with trigger finger and she saw her orthopedist recently. She reports that she mentioned to him that she was having shoulder and neck pain and he told her that it could be from an inflamed nerve but he did not give her any medication or adjusted any further. The patient reports that she was instructed to take diclofenac which she has been using without relief. He then told her to switch to ibuprofen. She reports it is not worse with movement but it's ache when she feels it in her shoulder upper back and arm. The patient denies any chest pain, shortness of breath, abdominal pain. She decided to come into the hospital today for further evaluation.   Past Medical History:  Diagnosis Date  . Depression   . Hemorrhoids   . Hypertension     There are no active problems to display for this patient.   History reviewed. No pertinent surgical history.  Prior to Admission medications   Medication Sig Start Date End Date Taking? Authorizing Provider  amLODipine (NORVASC) 5 MG tablet Take 1 tablet (5 mg total) by mouth daily. 01/28/16 05/13/16 Yes Almond LintAileen Ingal, MD  busPIRone (BUSPAR) 7.5 MG tablet Take 7.5 mg by mouth 2 (two) times daily.   Yes Historical Provider, MD  cetirizine (ZYRTEC) 10 MG tablet Take 10 mg by mouth  daily.   Yes Historical Provider, MD  ibuprofen (ADVIL,MOTRIN) 200 MG tablet Take 400 mg by mouth.   Yes Historical Provider, MD  omeprazole (PRILOSEC) 20 MG capsule Take 20 mg by mouth daily.   Yes Historical Provider, MD  sertraline (ZOLOFT) 100 MG tablet Take 150 mg by mouth daily.   Yes Historical Provider, MD  etodolac (LODINE) 200 MG capsule Take 1 capsule (200 mg total) by mouth every 8 (eight) hours. 05/13/16   Rebecka ApleyAllison P Rhodie Cienfuegos, MD  lidocaine (LIDODERM) 5 % Place 1 patch onto the skin every 12 (twelve) hours. Remove & Discard patch within 12 hours or as directed by MD 05/13/16 05/13/17  Rebecka ApleyAllison P Lunabella Badgett, MD  meloxicam (MOBIC) 15 MG tablet Take 1 tablet (15 mg total) by mouth daily. Patient not taking: Reported on 05/13/2016 04/06/16   Chinita Pesterari B Triplett, FNP  traMADol (ULTRAM) 50 MG tablet Take 1 tablet (50 mg total) by mouth every 6 (six) hours as needed. 05/13/16   Rebecka ApleyAllison P Zainab Crumrine, MD    Allergies Sulfa antibiotics  No family history on file.  Social History Social History  Substance Use Topics  . Smoking status: Current Every Day Smoker    Packs/day: 1.00    Types: Cigarettes  . Smokeless tobacco: Never Used  . Alcohol use No    Review of Systems Constitutional: No fever/chills Eyes: No visual changes. ENT: No sore throat. Cardiovascular: Denies chest pain. Respiratory: Denies  shortness of breath. Gastrointestinal: No abdominal pain.  No nausea, no vomiting.  No diarrhea.  No constipation. Genitourinary: Negative for dysuria. Musculoskeletal: Left shoulder pain and neck pain Skin: Negative for rash. Neurological: Negative for headaches, focal weakness or numbness.  10-point ROS otherwise negative.  ____________________________________________   PHYSICAL EXAM:  VITAL SIGNS: ED Triage Vitals  Enc Vitals Group     BP 05/13/16 0354 (!) 154/91     Pulse Rate 05/13/16 0354 74     Resp 05/13/16 0354 18     Temp 05/13/16 0354 97.8 F (36.6 C)     Temp Source 05/13/16  0354 Oral     SpO2 05/13/16 0354 98 %     Weight 05/13/16 0356 135 lb (61.2 kg)     Height 05/13/16 0356 5\' 3"  (1.6 m)     Head Circumference --      Peak Flow --      Pain Score 05/13/16 0356 8     Pain Loc --      Pain Edu? --      Excl. in GC? --     Constitutional: Alert and oriented. Well appearing and in no acute distress. Eyes: Conjunctivae are normal. PERRL. EOMI. Head: Atraumatic. Nose: No congestion/rhinnorhea. Mouth/Throat: Mucous membranes are moist.  Oropharynx non-erythematous. Neck: No cervical spine tenderness to palpation. Cardiovascular: Normal rate, regular rhythm. Grossly normal heart sounds.  Good peripheral circulation. Respiratory: Normal respiratory effort.  No retractions. Lungs CTAB. Gastrointestinal: Soft and nontender. No distention. No abdominal bruits. No CVA tenderness. Musculoskeletal: No lower extremity tenderness nor edema.  No joint effusions. Neurologic:  Normal speech and language.  Skin:  Skin is warm, dry and intact.  Psychiatric: Mood and affect are normal.   ____________________________________________   LABS (all labs ordered are listed, but only abnormal results are displayed)  Labs Reviewed  BASIC METABOLIC PANEL - Abnormal; Notable for the following:       Result Value   Potassium 3.3 (*)    Glucose, Bld 114 (*)    Calcium 8.8 (*)    All other components within normal limits  CBC  TROPONIN I   ____________________________________________  EKG  ED ECG REPORT I, Rebecka Apley, the attending physician, personally viewed and interpreted this ECG.   Date: 05/13/2016  EKG Time: 405  Rate: 71  Rhythm: normal sinus rhythm  Axis: normal  Intervals:none  ST&T Change: none  ____________________________________________  RADIOLOGY  CXR DG cervical spine DG shoulder ____________________________________________   PROCEDURES  Procedure(s) performed: None  Procedures  Critical Care performed:  No  ____________________________________________   INITIAL IMPRESSION / ASSESSMENT AND PLAN / ED COURSE  Pertinent labs & imaging results that were available during my care of the patient were reviewed by me and considered in my medical decision making (see chart for details).  This is a 46 year old female who comes into the hospital today with some shoulder pain. The patient had some blood work done given that she was having pain down her arm as well which is negative. The patient reports that she has been seen at orthopedic surgery's office but she has not been fully evaluated. I will give the patient a Lidoderm patch as well as a shot of Toradol and I will reassess the patient.  Clinical Course as of May 14 807  Tue May 13, 2016  2956 No active cardiopulmonary disease. DG Chest 2 View [AW]  0800 C5-6 disc degeneration with moderate narrowing. DG Cervical Spine 2-3 Views [AW]  0800 There is no acute or significant chronic bony abnormality of the left shoulder.   DG Shoulder Left [AW]    Clinical Course User Index [AW] Rebecka Apley, MD   Patient does have some degenerative disc disease at C5/C6. Her pain is improved at this time with a Lidoderm patch and the Toradol. The patient will be discharged home to follow back up with orthopedic surgery for further evaluation of these symptoms. The patient has no other concerns at this time and she'll be discharged home.  ____________________________________________   FINAL CLINICAL IMPRESSION(S) / ED DIAGNOSES  Final diagnoses:  Cervical radiculopathy  Chronic left shoulder pain      NEW MEDICATIONS STARTED DURING THIS VISIT:  New Prescriptions   ETODOLAC (LODINE) 200 MG CAPSULE    Take 1 capsule (200 mg total) by mouth every 8 (eight) hours.   LIDOCAINE (LIDODERM) 5 %    Place 1 patch onto the skin every 12 (twelve) hours. Remove & Discard patch within 12 hours or as directed by MD   TRAMADOL (ULTRAM) 50 MG TABLET    Take 1  tablet (50 mg total) by mouth every 6 (six) hours as needed.     Note:  This document was prepared using Dragon voice recognition software and may include unintentional dictation errors.    Rebecka Apley, MD 05/13/16 [id number]

## 2016-05-13 NOTE — ED Triage Notes (Signed)
Pt ambulatory to triage with steady gait with c/o lefts sided neck pain radiating to left shoulder, shoulder blade, back, left arm and hand x 2 weeks. Pt denies known injury. Pt reports intermittent left arm and hand numbness. Pt chest pain, headache, dizziness. Pt alert and oriented x 4, skin warm and dry.

## 2016-05-20 ENCOUNTER — Other Ambulatory Visit: Payer: Self-pay | Admitting: Cardiology

## 2016-06-07 ENCOUNTER — Emergency Department
Admission: EM | Admit: 2016-06-07 | Discharge: 2016-06-07 | Disposition: A | Discharge status: Home / Self Care | Payer: BLUE CROSS/BLUE SHIELD | Attending: Emergency Medicine | Admitting: Emergency Medicine

## 2016-06-07 DIAGNOSIS — I10 Essential (primary) hypertension: Secondary | ICD-10-CM | POA: Diagnosis not present

## 2016-06-07 DIAGNOSIS — M549 Dorsalgia, unspecified: Secondary | ICD-10-CM | POA: Diagnosis not present

## 2016-06-07 DIAGNOSIS — M542 Cervicalgia: Secondary | ICD-10-CM | POA: Diagnosis present

## 2016-06-07 DIAGNOSIS — M5412 Radiculopathy, cervical region: Secondary | ICD-10-CM | POA: Insufficient documentation

## 2016-06-07 DIAGNOSIS — F17210 Nicotine dependence, cigarettes, uncomplicated: Secondary | ICD-10-CM | POA: Diagnosis not present

## 2016-06-07 DIAGNOSIS — G8929 Other chronic pain: Secondary | ICD-10-CM | POA: Diagnosis not present

## 2016-06-07 MED ORDER — OXYCODONE-ACETAMINOPHEN 5-325 MG PO TABS: 1.0000 | ORAL_TABLET | Freq: Four times a day (QID) | ORAL | 0 refills | Status: DC | PRN

## 2016-06-07 NOTE — ED Provider Notes (Signed)
[facility] Emergency Department Provider Note   ____________________________________________   First MD Initiated Contact with Patient 06/07/16 1055     (approximate)  I have reviewed the triage vital signs and the nursing notes.   HISTORY  Chief Complaint Back Pain    HPI Hannah Carey is a 46 y.o. female patient complaining of upper back and neck pain. Patient describes the pain as "burning". Patient is followed by orthopedics and is placed on a Medrol Dosepak for 12 days. Patient states she has 3 more days to go. Patient state orthopedics advised her that they will consider MRI if no improvement with steroids. Patient states she has contacted them and will see them later this week. Patient currently rates the pain as a 7/10. No other palliative measures for this complaint.   Past Medical History:  Diagnosis Date  . Depression   . Hemorrhoids   . Hypertension     There are no active problems to display for this patient.   History reviewed. No pertinent surgical history.  Prior to Admission medications   Medication Sig Start Date End Date Taking? Authorizing Provider  amLODipine (NORVASC) 5 MG tablet TAKE 1 TABLET (5 MG TOTAL) BY MOUTH DAILY. 05/20/16 08/18/16  Almond Lint, MD  busPIRone (BUSPAR) 7.5 MG tablet Take 7.5 mg by mouth 2 (two) times daily.    Historical Provider, MD  cetirizine (ZYRTEC) 10 MG tablet Take 10 mg by mouth daily.    Historical Provider, MD  etodolac (LODINE) 200 MG capsule Take 1 capsule (200 mg total) by mouth every 8 (eight) hours. 05/13/16   Rebecka Apley, MD  ibuprofen (ADVIL,MOTRIN) 200 MG tablet Take 400 mg by mouth.    Historical Provider, MD  lidocaine (LIDODERM) 5 % Place 1 patch onto the skin every 12 (twelve) hours. Remove & Discard patch within 12 hours or as directed by MD 05/13/16 05/13/17  Rebecka Apley, MD  meloxicam (MOBIC) 15 MG tablet Take 1 tablet (15 mg total) by mouth daily. Patient not taking:  Reported on 05/13/2016 04/06/16   Chinita Pester, FNP  omeprazole (PRILOSEC) 20 MG capsule Take 20 mg by mouth daily.    Historical Provider, MD  oxyCODONE-acetaminophen (ROXICET) 5-325 MG tablet Take 1 tablet by mouth every 6 (six) hours as needed for moderate pain. 06/07/16   Joni Reining, PA-C  sertraline (ZOLOFT) 100 MG tablet Take 150 mg by mouth daily.    Historical Provider, MD  traMADol (ULTRAM) 50 MG tablet Take 1 tablet (50 mg total) by mouth every 6 (six) hours as needed. 05/13/16   Rebecka Apley, MD    Allergies Sulfa antibiotics  No family history on file.  Social History Social History  Substance Use Topics  . Smoking status: Current Every Day Smoker    Packs/day: 1.00    Types: Cigarettes  . Smokeless tobacco: Never Used  . Alcohol use No    Review of Systems Constitutional: No fever/chills Eyes: No visual changes. ENT: No sore throat. Cardiovascular: Denies chest pain. Respiratory: Denies shortness of breath. Gastrointestinal: No abdominal pain.  No nausea, no vomiting.  No diarrhea.  No constipation. Genitourinary: Negative for dysuria. Musculoskeletal: Chronic upper back pain  Skin: Negative for rash. Neurological: Negative for headaches, focal weakness or numbness. Psychiatric:Depression Endocrine:Hypertension Hematological/Lymphatic: Allergic/Immunilogical: Sulfa medications  ____________________________________________   PHYSICAL EXAM:  VITAL SIGNS: ED Triage Vitals [06/07/16 1033]  Enc Vitals Group     BP (!) 153/93  Pulse Rate 100     Resp 18     Temp 98.1 F (36.7 C)     Temp Source Oral     SpO2 100 %     Weight 135 lb (61.2 kg)     Height 5\' 3"  (1.6 m)     Head Circumference      Peak Flow      Pain Score      Pain Loc      Pain Edu?      Excl. in GC?     Constitutional: Alert and oriented. Well appearing and in no acute distress. Eyes: Conjunctivae are normal. PERRL. EOMI. Head: Atraumatic. Nose: No  congestion/rhinnorhea. Mouth/Throat: Mucous membranes are moist.  Oropharynx non-erythematous. Neck: No stridor.   cervical spine tenderness to palpation C5 and 6. Hematological/Lymphatic/Immunilogical: No cervical lymphadenopathy. Cardiovascular: Normal rate, regular rhythm. Grossly normal heart sounds.  Good peripheral circulation. Respiratory: Normal respiratory effort.  No retractions. Lungs CTAB. Gastrointestinal: Soft and nontender. No distention. No abdominal bruits. No CVA tenderness. Musculoskeletal: No lower extremity tenderness nor edema.  No joint effusions. Neurologic:  Normal speech and language. No gross focal neurologic deficits are appreciated. No gait instability. Skin:  Skin is warm, dry and intact. No rash noted. Psychiatric: Mood and affect are normal. Speech and behavior are normal.  ____________________________________________   LABS (all labs ordered are listed, but only abnormal results are displayed)  Labs Reviewed - No data to display ____________________________________________  EKG   ____________________________________________  RADIOLOGY  Reviewed x-rays which show mild endplate originating degenerative changes at C5-6. There is also  facet spurring at C7-T1. ____________________________________________   PROCEDURES  Procedure(s) performed: None  Procedures  Critical Care performed: No  ____________________________________________   INITIAL IMPRESSION / ASSESSMENT AND PLAN / ED COURSE  Pertinent labs & imaging results that were available during my care of the patient were reviewed by me and considered in my medical decision making (see chart for details).  Cervical radiculopathy. Patient given discharge Instructions. Patient advised to continue her steroids as directed. Patient given a prescription for 3 days of Percocet. Patient advised to follow-up with orthopedic as directed.       ____________________________________________   FINAL CLINICAL IMPRESSION(S) / ED DIAGNOSES  Final diagnoses:  Cervical radiculitis      NEW MEDICATIONS STARTED DURING THIS VISIT:  New Prescriptions   OXYCODONE-ACETAMINOPHEN (ROXICET) 5-325 MG TABLET    Take 1 tablet by mouth every 6 (six) hours as needed for moderate pain.     Note:  This document was prepared using Dragon voice recognition software and may include unintentional dictation errors.    Joni Reiningonald K Marlayna Bannister, PA-C 06/07/16 1110    Jeanmarie PlantJames A McShane, MD 06/07/16 [phone]

## 2016-06-07 NOTE — Discharge Instructions (Signed)
Continue prednisone as directed and follow-up with orthopedic DrMarland Kitchen..

## 2016-06-07 NOTE — ED Triage Notes (Addendum)
Pt came to ED c/o upper back pain and reports "It feels like it is on fire." Pt has chronic back pain. Recently started on prednisone. Alert and oriented x4. Ambulatory in triage.

## 2016-06-07 NOTE — ED Notes (Signed)

## 2016-06-20 ENCOUNTER — Other Ambulatory Visit: Payer: Self-pay | Admitting: Physician Assistant

## 2016-06-20 DIAGNOSIS — M5412 Radiculopathy, cervical region: Secondary | ICD-10-CM

## 2016-06-26 ENCOUNTER — Other Ambulatory Visit: Payer: Self-pay

## 2016-06-26 MED ORDER — AMLODIPINE BESYLATE 5 MG PO TABS: 5.0000 mg | ORAL_TABLET | Freq: Every day | ORAL | 0 refills | Status: DC

## 2016-06-26 NOTE — Telephone Encounter (Signed)
Requested Prescriptions   Signed Prescriptions Disp Refills  . amLODipine (NORVASC) 5 MG tablet 90 tablet 0    Sig: Take 1 tablet (5 mg total) by mouth daily.    Authorizing Provider: Almond LintINGAL, AILEEN    Ordering User: Margrett RudSLAYTON, BRITTANY N

## 2016-09-05 ENCOUNTER — Other Ambulatory Visit: Payer: Self-pay | Admitting: Nurse Practitioner

## 2016-09-05 ENCOUNTER — Ambulatory Visit
Admission: RE | Admit: 2016-09-05 | Discharge: 2016-09-05 | Disposition: A | Discharge status: Home / Self Care | Payer: BLUE CROSS/BLUE SHIELD | Source: Ambulatory Visit | Attending: Nurse Practitioner | Admitting: Nurse Practitioner

## 2016-09-05 DIAGNOSIS — R52 Pain, unspecified: Secondary | ICD-10-CM

## 2016-09-05 DIAGNOSIS — M79641 Pain in right hand: Secondary | ICD-10-CM | POA: Diagnosis not present

## 2016-09-05 DIAGNOSIS — M79642 Pain in left hand: Secondary | ICD-10-CM | POA: Diagnosis present

## 2016-10-24 ENCOUNTER — Other Ambulatory Visit: Payer: Self-pay | Admitting: *Deleted

## 2016-10-24 MED ORDER — AMLODIPINE BESYLATE 5 MG PO TABS: 5.0000 mg | ORAL_TABLET | Freq: Every day | ORAL | 3 refills | Status: AC

## 2017-03-11 ENCOUNTER — Ambulatory Visit: Payer: BLUE CROSS/BLUE SHIELD | Attending: Orthopedic Surgery | Admitting: Occupational Therapy

## 2017-03-11 ENCOUNTER — Encounter: Payer: Self-pay | Admitting: Occupational Therapy

## 2017-03-11 DIAGNOSIS — L905 Scar conditions and fibrosis of skin: Secondary | ICD-10-CM | POA: Diagnosis present

## 2017-03-11 DIAGNOSIS — M25642 Stiffness of left hand, not elsewhere classified: Secondary | ICD-10-CM | POA: Insufficient documentation

## 2017-03-11 DIAGNOSIS — M6281 Muscle weakness (generalized): Secondary | ICD-10-CM | POA: Diagnosis present

## 2017-03-11 DIAGNOSIS — M79641 Pain in right hand: Secondary | ICD-10-CM | POA: Diagnosis present

## 2017-03-11 DIAGNOSIS — M25542 Pain in joints of left hand: Secondary | ICD-10-CM | POA: Diagnosis not present

## 2017-03-11 DIAGNOSIS — M25641 Stiffness of right hand, not elsewhere classified: Secondary | ICD-10-CM | POA: Insufficient documentation

## 2017-03-11 NOTE — Therapy (Signed)
[facility] PHYSICAL AND SPORTS MEDICINE [address] [place], [address] Phone: [phone]   Fax:  [phone]  Occupational Therapy Evaluation  Patient Details  Name: Hannah Carey MRN: [id number] Date of Birth: 05/20/1970 Referring Provider: Rosita Kea   Encounter Date: 03/11/2017  OT End of Session - 03/11/17 2148    Visit Number  1    Number of Visits  8    Date for OT Re-Evaluation  04/08/17    OT Start Time  1432    OT Stop Time  1528    OT Time Calculation (min)  56 min    Activity Tolerance  Patient tolerated treatment well    Behavior During Therapy  [facility] for tasks assessed/performed       Past Medical History:  Diagnosis Date  . Depression   . Hemorrhoids   . Hypertension     History reviewed. No pertinent surgical history.  There were no vitals filed for this visit.  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  Yes    Pain Score  6     Pain Location  Hand    Pain Orientation  Right;Left    Pain Descriptors / Indicators  Aching;Sharp    Pain Onset  1 to 4 weeks ago    Pain Frequency  Constant        [facility] OT Assessment - 03/11/17 0001      Assessment   Diagnosis  R 3rd and 4th trigger finger and L thumb release    Referring Provider  Rosita Kea    Onset Date  02/16/17      Home  Environment   Lives With  Family      Prior Function   Vocation  Full time employment    Leisure  R hand domimant, likes to do [employer] , family time , watch tv, garden      Right Hand AROM   R Index  MCP 0-90   80 Degrees    R Long  MCP 0-90  85 Degrees      Left Hand AROM   L Thumb Opposition to Index  -- Opposition to 2nd fold of 5th    Opposition to 2nd fold of 5th       Contrast done prior to review of HEP - report decrease pain and increase ROM  Review HEP and hand out provided    Contrast Scar massage and dezentization - rougher textures  Cica scar pad over R hand scar and digi sleeve silicon on L thumb  Night time   Tendon glides - but not force on R hand  Tapping of digits R hand  L hand thumb PA and RA  L hand opposition too all digits   Avoid tight and sustained grip - built up handles               OT Education - 03/11/17 2147  Education provided  Yes    Education Details  findings of eval and HEP     Person(s) Educated  Patient    Methods  Explanation;Demonstration;Tactile cues;Verbal cues;Handout    Comprehension  Verbalized understanding;Need further instruction;Returned demonstration       OT Short Term Goals - 03/11/17 2157      OT SHORT TERM GOAL #1   Title  Pain on PRWHE improve with more than 20 points     Baseline  Pain at eval on PRWHE  36/50     Time  2    Period  Weeks    Status  New    Target Date  03/25/17      OT SHORT TERM GOAL #2   Title  AROM in R and L hand extention and flexion improve to WNL with out increase symptoms     Baseline  increase discomfort in extention and end range flexion     Time  2    Period  Weeks    Status  New    Target Date  03/25/17        OT Long Term Goals - 03/11/17 2159      OT LONG TERM GOAL #1   Title  Grip and prehension strength improve to with in range for her age     Baseline  NT 3 1/2 wks s/p    Time  4    Period  Weeks    Status  New    Target Date  04/08/17      OT LONG TERM GOAL #2   Title  Function on PRWHE improve with more than 20 points     Baseline  Function score on PRWHE at eval 33/50     Time  4    Period  Weeks    Status  New    Target Date  04/08/17             Plan - 03/11/17 2150    Clinical Impression Statement  Pt 3 1/2 wks s/p trigger finger release on R 3rd and 4th - and R thumb - pt cont to have increase pian over scars and with extention of digits and making fist - limiting her functional use of R and L hand -pt work  involve gripping , pinching , puling - and production - scars still tender and  sensitive - and discomfort more with digits extention than flexion - but sustained grip and prhennsion increase symptoms     Occupational performance deficits (Please refer to evaluation for details):  ADL's;IADL's;Work;Play;Leisure    OT Frequency  2x / week    OT Duration  4 weeks    OT Treatment/Interventions  Self-care/ADL training;Patient/family education;Therapeutic exercises;Manual Therapy;Scar mobilization;Ultrasound;Cryotherapy;Contrast Bath    Plan  assess progress in scar management, pain ,    Clinical Decision Making  Several treatment options, min-mod task modification necessary    OT Home Exercise Plan  see pt instruction        Patient will benefit from skilled therapeutic intervention in order to improve the following deficits and impairments:  Pain, Impaired flexibility, Decreased scar mobility, Decreased strength, Decreased range of motion  Visit Diagnosis: Pain in joint of left hand - Plan: Ot plan of care cert/re-cert  Pain in right hand - Plan: Ot plan of care cert/re-cert  Stiffness of right hand, not elsewhere classified - Plan: Ot plan of care cert/re-cert  Stiffness of left hand, not elsewhere classified - Plan: Ot plan  of care cert/re-cert  Scar tissue - Plan: Ot plan of care cert/re-cert  Muscle weakness (generalized) - Plan: Ot plan of care cert/re-cert    Problem List There are no active problems to display for this patient.   Oletta CohnuPreez, Linley Moxley OTR/L,CLT 03/11/2017, 10:07 PM  [facility] PH[facility] SP[facility]TS [facility]ICINE [address] [place], [place],  [id number] Phone: [phone]   Fax:  [phone]  Name: Hannah Carey MRN: [id number] Date of Birth: 03/02/1971

## 2017-03-11 NOTE — Patient Instructions (Signed)
Contrast Scar massage and dezentization - rougher textures  Cica scar pad over R hand scar and digi sleeve silicon on L thumb  Night time   Tendon glides - but not force on R hand  Tapping of digits R hand  L hand thumb PA and RA  L hand opposition too all digits   Avoid tight and sustained grip - built up handles

## 2017-03-16 ENCOUNTER — Ambulatory Visit: Payer: BLUE CROSS/BLUE SHIELD | Admitting: Occupational Therapy

## 2017-03-16 DIAGNOSIS — M79641 Pain in right hand: Secondary | ICD-10-CM

## 2017-03-16 DIAGNOSIS — M25641 Stiffness of right hand, not elsewhere classified: Secondary | ICD-10-CM

## 2017-03-16 DIAGNOSIS — M25542 Pain in joints of left hand: Secondary | ICD-10-CM

## 2017-03-16 DIAGNOSIS — M25642 Stiffness of left hand, not elsewhere classified: Secondary | ICD-10-CM

## 2017-03-16 DIAGNOSIS — L905 Scar conditions and fibrosis of skin: Secondary | ICD-10-CM

## 2017-03-16 DIAGNOSIS — M6281 Muscle weakness (generalized): Secondary | ICD-10-CM

## 2017-03-16 NOTE — Therapy (Signed)
[facility] [address] [place], [place], [address] Phone: [phone]   Fax:  [phone]  Occupational Therapy Treatment  Patient Details  Name: Hannah Carey MRN: [id number] Date of Birth: 06/04/1970 Referring Provider: Rosita KeaMenz   Encounter Date: 03/16/2017  OT End of Session - 03/16/17 0842    Visit Number  2    Number of Visits  8    Date for OT Re-Evaluation  04/08/17    OT Start Time  0834    OT Stop Time  0912    OT Time Calculation (min)  38 min    Activity Tolerance  Patient tolerated treatment well    Behavior During Therapy  [facility] for tasks assessed/performed       Past Medical History:  Diagnosis Date  . Depression   . Hemorrhoids   . Hypertension     No past surgical history on file.  There were no vitals filed for this visit.  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  Yes    Pain Score  7     Pain Location  Hand    Pain Orientation  Right;Left    Pain Descriptors / Indicators  Aching    Pain Onset  1 to 4 weeks ago    Pain Frequency  Intermittent                   OT Treatments/Exercises (OP) - 03/16/17 0001      RUE Fluidotherapy   Number Minutes Fluidotherapy  8 Minutes    RUE Fluidotherapy Location  Hand    [facility] - [facility]       LUE Fluidotherapy   Number Minutes Fluidotherapy  8 Minutes    LUE Fluidotherapy Location  Hand    [facility]        fluido therapy done - after assessing scar    Scar massage and dezentization  Used some vibration this date  Review again Cica  scar pad over R hand scar and digi sleeve silicon on L thumb  Night time  Pt to only do scar massage 3 x day - and increase ice massage for edema over scars at R hand   Tendon glides - but not force on R hand - 10 reps  Tapping of digits R hand 10 reps  L hand thumb PA and RA  10 reps each  L hand opposition too all digits - able to slide down base of 5th with ease  Stop when feeling pain   US at 3.3MHZ 20% at .8 intensity for 8 min total over 3 scars decrease edema and pain    Avoid tight and sustained grip - built up handles           OT Education - 03/16/17 0841    Education provided  Yes    Education Details  review HEP - scar tissue ,  Person(s) Educated  Patient    Methods  Explanation;Demonstration;Tactile cues;Verbal cues    Comprehension  Verbalized understanding;Returned demonstration;Verbal cues required       OT Short Term Goals - 03/11/17 2157      OT SHORT TERM GOAL #1   Title  Pain on PRWHE improve with more than 20 points     Baseline  Pain at eval on PRWHE  36/50     Time  2    Period  Weeks    Status  New    Target Date  03/25/17      OT SHORT TERM GOAL #2   Title  AROM in R and L hand extention and flexion improve to WNL with out increase symptoms     Baseline  increase discomfort in extention and end range flexion     Time  2    Period  Weeks    Status  New    Target Date  03/25/17        OT Long Term Goals - 03/11/17 2159      OT LONG TERM GOAL #1   Title  Grip and prehension strength improve to with in range for her age     Baseline  NT 3 1/2 wks s/p    Time  4    Period  Weeks    Status  New    Target Date  04/08/17      OT LONG TERM GOAL #2   Title  Function on PRWHE improve with more than 20 points     Baseline  Function score on PRWHE at eval 33/50     Time  4    Period  Weeks    Status  New    Target Date  04/08/17            Plan - 03/16/17 0843    Clinical Impression Statement  Pt show progress in scars  - still tender over R 3rd one the most - edema over R hand scars - with some discomfort and pain with making fist - L thumb one improving - cont to decrease scartissue, pain , edema and increase use     Occupational performance deficits (Please refer to evaluation for details):  ADL's;IADL's;Work;Play;Leisure    Rehab Potential  Good    OT Frequency  2x / week    OT Duration  4 weeks    OT Treatment/Interventions  Self-care/ADL training;Patient/family education;Therapeutic exercises;Manual Therapy;Scar mobilization;Ultrasound;Cryotherapy;Contrast Bath    Plan  cont to decrease scar tissue and pain - increase use     Clinical Decision Making  Several treatment options, min-mod task modification necessary    OT Home Exercise Plan  see pt instruction     Consulted and Agree with Plan of Care  Patient       Patient will benefit from skilled therapeutic intervention in order to improve the following deficits and impairments:  Pain, Impaired flexibility, Decreased scar mobility, Decreased strength, Decreased range of motion  Visit Diagnosis: Pain in joint of left hand  Pain in right hand  Stiffness of right hand, not elsewhere classified  Stiffness of left hand, not elsewhere classified  Scar tissue  Muscle weakness (generalized)    Problem List There are no active problems to display for this patient.   Oletta Cohn OTR/L,CLT 03/16/2017, 9:18 AM   [facility] PHYSICAL AND SPORTS MEDICINE [address] [place], [address] Phone: [phone]   Fax:  [phone]  Name: Hannah Carey MRN: [id number] Date of Birth: 01/24/1971

## 2017-03-16 NOTE — Patient Instructions (Addendum)
Same HEP - but increase ice massage  And only do scar massage 3 x day

## 2017-03-19 ENCOUNTER — Ambulatory Visit: Payer: BLUE CROSS/BLUE SHIELD | Admitting: Occupational Therapy

## 2017-03-19 DIAGNOSIS — L905 Scar conditions and fibrosis of skin: Secondary | ICD-10-CM

## 2017-03-19 DIAGNOSIS — M25542 Pain in joints of left hand: Secondary | ICD-10-CM | POA: Diagnosis not present

## 2017-03-19 DIAGNOSIS — M6281 Muscle weakness (generalized): Secondary | ICD-10-CM

## 2017-03-19 DIAGNOSIS — M25642 Stiffness of left hand, not elsewhere classified: Secondary | ICD-10-CM

## 2017-03-19 DIAGNOSIS — M25641 Stiffness of right hand, not elsewhere classified: Secondary | ICD-10-CM

## 2017-03-19 DIAGNOSIS — M79641 Pain in right hand: Secondary | ICD-10-CM
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
## 2017-03-19 NOTE — Patient Instructions (Signed)
Same HEP  

## 2017-03-23 ENCOUNTER — Ambulatory Visit: Payer: BLUE CROSS/BLUE SHIELD | Admitting: Occupational Therapy

## 2017-03-23 DIAGNOSIS — M25642 Stiffness of left hand, not elsewhere classified: Secondary | ICD-10-CM

## 2017-03-23 DIAGNOSIS — M25542 Pain in joints of left hand: Secondary | ICD-10-CM | POA: Diagnosis not present

## 2017-03-23 DIAGNOSIS — L905 Scar conditions and fibrosis of skin: Secondary | ICD-10-CM

## 2017-03-23 DIAGNOSIS — M6281 Muscle weakness (generalized): Secondary | ICD-10-CM

## 2017-03-23 DIAGNOSIS — M79641 Pain in right hand: Secondary | ICD-10-CM

## 2017-03-23 DIAGNOSIS — M25641 Stiffness of right hand, not elsewhere classified: Secondary | ICD-10-CM

## 2017-03-23 NOTE — Patient Instructions (Signed)
Same HEP  
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
## 2017-03-31 ENCOUNTER — Ambulatory Visit: Payer: BLUE CROSS/BLUE SHIELD | Admitting: Occupational Therapy

## 2017-03-31 DIAGNOSIS — M25542 Pain in joints of left hand: Secondary | ICD-10-CM

## 2017-03-31 DIAGNOSIS — M25641 Stiffness of right hand, not elsewhere classified: Secondary | ICD-10-CM

## 2017-03-31 DIAGNOSIS — M79641 Pain in right hand: Secondary | ICD-10-CM

## 2017-03-31 DIAGNOSIS — L905 Scar conditions and fibrosis of skin: Secondary | ICD-10-CM

## 2017-03-31 DIAGNOSIS — M6281 Muscle weakness (generalized): Secondary | ICD-10-CM

## 2017-03-31 DIAGNOSIS — M25642 Stiffness of left hand, not elsewhere classified: Secondary | ICD-10-CM

## 2017-03-31 NOTE — Therapy (Signed)
[place] PHYSICAL AND SPORTS MEDICINE [address] [place], [address] Phone: [phone]   Fax:  [phone]  Occupational Therapy Treatment/discharge  Patient Details  Name: Hannah Carey MRN: [id number] Date of Birth: 1971-02-24 Referring Provider: Rudene Christians   Encounter Date: 03/31/2017  OT End of Session - 03/31/17 1007    Visit Number  5    Number of Visits  5    Date for OT Re-Evaluation  03/31/17    OT Start Time  1010    OT Stop Time  1040    OT Time Calculation (min)  30 min    Activity Tolerance  Patient tolerated treatment well    Behavior During Therapy  [facility] for tasks assessed/performed       Past Medical History:  Diagnosis Date  . Depression   . Hemorrhoids   . Hypertension     No past surgical history on file.  There were no vitals filed for this visit.  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  No/denies         [facility] OT Assessment - 03/31/17 0001      Strength   Right Hand Grip (lbs)  36    Right Hand Lateral Pinch  13 lbs    Right Hand 3 Point Pinch  12 lbs    Left Hand Grip (lbs)  36    Left Hand Lateral Pinch  11 lbs    Left Hand 3 Point Pinch  10.5 lbs      Right Hand AROM   R Index  MCP 0-90  85 Degrees    R Long  MCP 0-90  90 Degrees       assess ROM and grip/prehension strength- pt 6 wks s/p         OT Treatments/Exercises (OP) - 03/31/17 0001      RUE Fluidotherapy   Number Minutes Fluidotherapy  10 Minutes    RUE Fluidotherapy Location  Hand    Comments  AROM [facility] to decrease stiffness     PRWHE done for pain 26/50 and function 12/50      Scar massage done  Used some  vibration on 3rd digit scar with AROM   Provided new  Cica scar pad over R hand scar-Night time  Pt to only do scar massage 3 x day - but do opposite on scar with AROM for 3rd and 4th -  and increase ice massage for edema over scars at R hand   Tendon glides - but not force on R hand - 10 reps  Add prayer stretch for composite extention stretch  Able to make more composite fist this date  Tapping of digits R hand 10 reps- great ROM L hand opposition too all digits - able to slide down base of 5th with ease  Stop when feeling pain  Review to do some compensating at work - using palms to carry object and built up handle to avoid tight grip        OT Education - 03/31/17 1006  Education provided  Yes    Education Details  discharge instruction, scar pad and massage     Person(s) Educated  Patient    Methods  Explanation;Demonstration;Tactile cues    Comprehension  Verbalized understanding;Need further instruction;Returned demonstration       OT Short Term Goals - 03/31/17 1052      OT SHORT TERM GOAL #1   Title  Pain on PRWHE improve with more than 20 points     Baseline  Pain at eval on PRWHE  36/50  and now 26/50     Status  Partially Met      OT SHORT TERM GOAL #2   Title  AROM in R and L hand extention and flexion improve to WNL with out increase symptoms     Status  Achieved        OT Long Term Goals - 03/31/17 1053      OT LONG TERM GOAL #1   Title  Grip and prehension strength improve to with in range for her age     Baseline  see flowsheet     Status  Achieved      OT [place] #2   Title  Function on PRWHE improve with more than 20 points     Baseline  Function score on PRWHE at eval 33/50  and now 12/50    Status  Achieved            Plan - 03/31/17 1007    Clinical Impression Statement  Pt show great progress in R 4th digit and L thumb pain and scar tissue- still some scar tissue on 3rd in R hand - achiness - provided new cica scar pad  , to cont with scar massage and ROM HEP - review some modification  going back to work to use for few more weeks -  pt met all goals except still achiness in R hand  but progressing     Occupational performance deficits (Please refer to evaluation for details):  ADL's;IADL's;Work;Play;Leisure    Rehab Potential  Good    OT Frequency  1x / week    OT Duration  2 weeks    OT Treatment/Interventions  --    Plan  discharge with homeprogram    OT Home Exercise Plan  see pt instruction     Consulted and Agree with Plan of Care  Patient       Patient will benefit from skilled therapeutic intervention in order to improve the following deficits and impairments:  Pain, Impaired flexibility, Decreased scar mobility, Decreased strength, Decreased range of motion  Visit Diagnosis: Pain in joint of left hand  Pain in right hand  Stiffness of right hand, not elsewhere classified  Stiffness of left hand, not elsewhere classified  Muscle weakness (generalized)  Scar tissue    Problem List There are no active problems to display for this patient.   Rosalyn Gess OTR/L,CLT 03/31/2017, 10:55 AM  [place] PHYSICAL AND SPORTS MEDICINE [address], [place], [address] Phone: [phone]   Fax:  [phone]  Name: Hannah Carey MRN: [id number] Date of Birth: [birth year]

## 2017-05-09 ENCOUNTER — Encounter: Payer: Self-pay | Admitting: Emergency Medicine

## 2017-05-09 ENCOUNTER — Emergency Department
Admission: EM | Admit: 2017-05-09 | Discharge: 2017-05-09 | Disposition: A | Discharge status: Home / Self Care | Payer: BLUE CROSS/BLUE SHIELD | Attending: Emergency Medicine | Admitting: Emergency Medicine

## 2017-05-09 DIAGNOSIS — R350 Frequency of micturition: Secondary | ICD-10-CM | POA: Diagnosis present

## 2017-05-09 DIAGNOSIS — R300 Dysuria: Secondary | ICD-10-CM | POA: Diagnosis not present

## 2017-05-09 DIAGNOSIS — I10 Essential (primary) hypertension: Secondary | ICD-10-CM | POA: Insufficient documentation

## 2017-05-09 DIAGNOSIS — F17210 Nicotine dependence, cigarettes, uncomplicated: Secondary | ICD-10-CM | POA: Diagnosis not present

## 2017-05-09 DIAGNOSIS — Z79899 Other long term (current) drug therapy: Secondary | ICD-10-CM | POA: Diagnosis not present

## 2017-05-09 LAB — URINALYSIS, COMPLETE (UACMP) WITH MICROSCOPIC
Bacteria, UA: NONE SEEN
Bilirubin Urine: NEGATIVE
GLUCOSE, UA: NEGATIVE mg/dL
Ketones, ur: NEGATIVE mg/dL
NITRITE: NEGATIVE
PH: 5 (ref 5.0–8.0)
Protein, ur: NEGATIVE mg/dL
SPECIFIC GRAVITY, URINE: 1.012 (ref 1.005–1.030)

## 2017-05-09 LAB — WET PREP, GENITAL
CLUE CELLS WET PREP: NONE SEEN
SPERM: NONE SEEN
Trich, Wet Prep: NONE SEEN
Yeast Wet Prep HPF POC: NONE SEEN

## 2017-05-09 MED ORDER — PHENAZOPYRIDINE HCL 100 MG PO TABS: 100.0000 mg | ORAL_TABLET | Freq: Three times a day (TID) | ORAL | 0 refills | Status: DC | PRN

## 2017-05-09 NOTE — ED Provider Notes (Signed)
[facility] Emergency Department Provider Note   ____________________________________________   First MD Initiated Contact with Patient 05/09/17 1328     (approximate)  I have reviewed the triage vital signs and the nursing notes.   HISTORY  Chief Complaint Urinary Frequency   HPI Hannah Carey is a 47 y.o. female s here with complaint of dysuria. Patient states that off and on for the last 3 days she has had urinary issues which have been unresolved after being treated with 3 different antibiotics. Patient states that her PCP at [facility] also treated her with Diflucan thinking that she may have a yeast infection. Patient states that there is frequency and burning. She denies any nausea, vomiting, fever or chills. She states she had a urine culture done at [facility] but is unaware of the findings. At that time her antibiotics were changed. She is sitting no improvement of her symptoms. She has pain is 7 out of 10.   Past Medical History:  Diagnosis Date  . Depression   . Hemorrhoids   . Hypertension     There are no active problems to display for this patient.   No past surgical history on file.  Prior to Admission medications   Medication Sig Start Date End Date Taking? Authorizing Provider  amLODipine (NORVASC) 5 MG tablet Take 1 tablet (5 mg total) by mouth daily. 10/24/16 01/22/17  Antonieta Iba, MD  busPIRone (BUSPAR) 7.5 MG tablet Take 7.5 mg by mouth 2 (two) times daily.    [provider]  cetirizine (ZYRTEC) 10 MG tablet Take 10 mg by mouth daily.    [provider]  etodolac (LODINE) 200 MG capsule Take 1 capsule (200 mg total) by mouth every 8 (eight) hours. 05/13/16   Rebecka Apley, MD  ibuprofen (ADVIL,MOTRIN) 200 MG tablet Take 400 mg by mouth.    [provider]  lidocaine (LIDODERM) 5 % Place 1 patch onto the skin every 12 (twelve) hours. Remove & Discard patch within 12 hours or as directed  by MD 05/13/16 05/13/17  Rebecka Apley, MD  meloxicam (MOBIC) 15 MG tablet Take 1 tablet (15 mg total) by mouth daily. Patient not taking: Reported on 05/13/2016 04/06/16   Kem Boroughs B, FNP  omeprazole (PRILOSEC) 20 MG capsule Take 20 mg by mouth daily.    [provider]  oxyCODONE-acetaminophen (ROXICET) 5-325 MG tablet Take 1 tablet by mouth every 6 (six) hours as needed for moderate pain. 06/07/16   Joni Reining, PA-C  phenazopyridine (PYRIDIUM) 100 MG tablet Take 1 tablet (100 mg total) by mouth 3 (three) times daily as needed for pain. 05/09/17 05/09/18  Tommi Rumps, PA-C  sertraline (ZOLOFT) 100 MG tablet Take 150 mg by mouth daily.    [provider]  traMADol (ULTRAM) 50 MG tablet Take 1 tablet (50 mg total) by mouth every 6 (six) hours as needed. 05/13/16   Rebecka Apley, MD    Allergies Sulfa antibiotics  No family history on file.  Social History Social History   Tobacco Use  . Smoking status: Current Every Day Smoker    Packs/day: 1.00    Types: Cigarettes  . Smokeless tobacco: Never Used  Substance Use Topics  . Alcohol use: No  . Drug use: No    Review of Systems Constitutional: No fever/chills Cardiovascular: Denies chest pain. Respiratory: Denies shortness of breath. Gastrointestinal: No abdominal pain.  No nausea, no vomiting.  Genitourinary:  Positive  dysuria and frequency. Musculoskeletal: Negative for back pain. Skin: Negative for rash. Neurological: Negative for  focal weakness or numbness. ____________________________________________   PHYSICAL EXAM:  VITAL SIGNS: ED Triage Vitals [05/09/17 1145]  Enc Vitals Group     BP (!) 118/93     Pulse Rate 98     Resp 16     Temp 98.3 F (36.8 C)     Temp Source Oral     SpO2 99 %     Weight 146 lb (66.2 kg)     Height 5\' 3"  (1.6 m)     Head Circumference      Peak Flow      Pain Score 7     Pain Loc      Pain Edu?      Excl. in GC?    Constitutional: Alert and  oriented. Well appearing and in no acute distress. Eyes: Conjunctivae are normal.  Head: Atraumatic. Nose: No congestion/rhinnorhea. Neck: No stridor.   Cardiovascular: Normal rate, regular rhythm. Grossly normal heart sounds.  Good peripheral circulation. Respiratory: Normal respiratory effort.  No retractions. Lungs CTAB. Gastrointestinal: Soft and nontender. No distention. No CVA tenderness. Genitourinary: external genitalia is within normal limits however vaginal walls are atrophic in appearance. No vaginal discharge is noted. Cultures were obtained. No adnexal masses or tenderness noted. No cervical motion tenderness present. Musculoskeletal: No lower extremity tenderness nor edema.  Neurologic:  Normal speech and language. No gross focal neurologic deficits are appreciated. No gait instability. Skin:  Skin is warm, dry and intact. No rash noted. Psychiatric: Mood and affect are normal. Speech and behavior are normal.  ____________________________________________   LABS (all labs ordered are listed, but only abnormal results are displayed)  Labs Reviewed  WET PREP, GENITAL - Abnormal; Notable for the following components:      Result Value   WBC, Wet Prep HPF POC FEW (*)    All other components within normal limits  URINALYSIS, COMPLETE (UACMP) WITH MICROSCOPIC - Abnormal; Notable for the following components:   Color, Urine YELLOW (*)    APPearance HAZY (*)    Hgb urine dipstick SMALL (*)    Leukocytes, UA TRACE (*)    Squamous Epithelial / LPF 6-30 (*)    All other components within normal limits  CHLAMYDIA/NGC RT PCR ([facility] ONLY)  URINE CULTURE    PROCEDURES  Procedure(s) performed: None  Procedures  Critical Care performed: No  ____________________________________________   INITIAL IMPRESSION / ASSESSMENT AND PLAN / ED COURSE Wet prep was discussed with patient. At this time it is not completely clear that she does have a urinary tract infection. Urine  culture was ordered. Patient is to follow-up with [facility] to discuss the probability of atrophic vaginitis causing her symptoms. At that time they can see the results of her urine culture.  ____________________________________________   FINAL CLINICAL IMPRESSION(S) / ED DIAGNOSES  Final diagnoses:  Dysuria     ED Discharge Orders        Ordered    phenazopyridine (PYRIDIUM) 100 MG tablet  3 times daily PRN     05/09/17 1505       Note:  This document was prepared using Dragon voice recognition software and may include unintentional dictation errors.    Tommi RumpsSummers, Lus Kriegel L, PA-C 05/09/17 1545    Governor RooksLord, Rebecca, MD 05/09/17 [phone]

## 2017-05-09 NOTE — Discharge Instructions (Signed)
Call and make an appointment with [facility] to follow up with your results today. Begin Taking Pyridium 100 mg 1 tablet 3 times a day. This medication will turn your urine a bright yellow orange. Increase fluids.

## 2017-05-09 NOTE — ED Notes (Signed)
Lab will add on urine culture  

## 2017-05-09 NOTE — ED Triage Notes (Signed)
Pt reports urinary sx with burning and urinary frequency. Denies any vaginal discharge, denies fever, denies vomiting.

## 2017-05-11 LAB — URINE CULTURE: SPECIAL REQUESTS: NORMAL

## 2017-05-13 NOTE — Progress Notes (Signed)
ED Antimicrobial Stewardship Positive Culture Follow Up   [name] is an 47 y.o. female who presented to [facility] with a chief complaint of  Chief Complaint  Patient presents with  . Urinary Frequency    Recent Results (from the past 720 hour(s))  Urine culture     Status: Abnormal   Collection Time: 05/09/17 11:47 AM  Result Value Ref Range Status   Specimen Description   Final    URINE, CLEAN CATCH Performed at [facility], [address] [place], [place] [address]    Special Requests   Final    Normal Performed at [facility], [address] [place], [place] [address]    Culture >=100,000 COLONIES/mL ENTEROBACTER AEROGENES (A)  Final   Report Status 05/11/2017 FINAL  Final   Organism ID, Bacteria ENTEROBACTER AEROGENES (A)  Final      Susceptibility   Enterobacter aerogenes - MIC*    CEFAZOLIN >=64 RESISTANT Resistant     CEFTRIAXONE 4 SENSITIVE Sensitive     CIPROFLOXACIN <=0.25 SENSITIVE Sensitive     GENTAMICIN <=1 SENSITIVE Sensitive     IMIPENEM 1 SENSITIVE Sensitive     NITROFURANTOIN 32 SENSITIVE Sensitive     TRIMETH/SULFA <=20 SENSITIVE Sensitive     PIP/TAZO 16 SENSITIVE Sensitive     * >=100,000 COLONIES/mL ENTEROBACTER AEROGENES  Wet prep, genital     Status: Abnormal   Collection Time: 05/09/17  2:11 PM  Result Value Ref Range Status   Yeast Wet Prep HPF POC NONE SEEN NONE SEEN Final   Trich, Wet Prep NONE SEEN NONE SEEN Final   Clue Cells Wet Prep HPF POC NONE SEEN NONE SEEN Final   WBC, Wet Prep HPF POC FEW (A) NONE SEEN Final   Sperm NONE SEEN  Final    Comment: Performed at [facility], [address] [place], [place] [address]    [x]  Patient discharged originally without antimicrobial agent and treatment is now indicated.   Patient was instructed to F/U with [facility] (patient's PCP) after ED discharge.   According to [facility] they are aware of culture results and patient was called  in RX for ciprofloxacin on 05/11/17.   Gardner CandleSheema M Edwen Mclester ,PHARMD, BCPS Clinical Pharmacist  05/13/2017, 3:21 PM

## 2017-06-20 ENCOUNTER — Encounter: Payer: Self-pay | Admitting: Emergency Medicine

## 2017-06-20 ENCOUNTER — Emergency Department
Admission: EM | Admit: 2017-06-20 | Discharge: 2017-06-20 | Disposition: A | Discharge status: Home / Self Care | Payer: BLUE CROSS/BLUE SHIELD | Attending: Emergency Medicine | Admitting: Emergency Medicine

## 2017-06-20 ENCOUNTER — Other Ambulatory Visit: Payer: Self-pay

## 2017-06-20 ENCOUNTER — Emergency Department: Payer: BLUE CROSS/BLUE SHIELD

## 2017-06-20 DIAGNOSIS — M79605 Pain in left leg: Secondary | ICD-10-CM

## 2017-06-20 DIAGNOSIS — R600 Localized edema: Secondary | ICD-10-CM | POA: Insufficient documentation

## 2017-06-20 DIAGNOSIS — F17210 Nicotine dependence, cigarettes, uncomplicated: Secondary | ICD-10-CM | POA: Insufficient documentation

## 2017-06-20 DIAGNOSIS — Z79899 Other long term (current) drug therapy: Secondary | ICD-10-CM | POA: Diagnosis not present

## 2017-06-20 DIAGNOSIS — Z882 Allergy status to sulfonamides status: Secondary | ICD-10-CM | POA: Diagnosis not present

## 2017-06-20 DIAGNOSIS — I10 Essential (primary) hypertension: Secondary | ICD-10-CM | POA: Diagnosis not present

## 2017-06-20 MED ORDER — NAPROXEN 500 MG PO TABS: 500.0000 mg | ORAL_TABLET | Freq: Two times a day (BID) | ORAL | 0 refills | Status: DC

## 2017-06-20 NOTE — ED Provider Notes (Signed)
[facility] Emergency Department Provider Note  ____________________________________________   First MD Initiated Contact with Patient 06/20/17 1031     (approximate)  I have reviewed the triage vital signs and the nursing notes.   HISTORY  Chief Complaint Leg Pain    HPI Hannah Carey is a 47 y.o. female who presents to the ER complaining of left leg pain.  She states there is some swelling behind the knee.  She denies any numbness or tingling.  She denies any back pain.  States the pain is in the calf the back of the knee and into the top of the thigh.  She is concerned she has a blood clot.  Patient is a smoker.  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  She denies any injury.  She denies chest pain or shortness of breath  Past Medical History:  Diagnosis Date  . Depression   . Hemorrhoids   . Hypertension     There are no active problems to display for this patient.   History reviewed. No pertinent surgical history.  Prior to Admission medications   Medication Sig Start Date End Date Taking? Authorizing Provider  amLODipine (NORVASC) 5 MG tablet Take 1 tablet (5 mg total) by mouth daily. 10/24/16 01/22/17  Antonieta Iba, MD  busPIRone (BUSPAR) 7.5 MG tablet Take 7.5 mg by mouth 2 (two) times daily.    [provider]  cetirizine (ZYRTEC) 10 MG tablet Take 10 mg by mouth daily.    [provider]  naproxen (NAPROSYN) 500 MG tablet Take 1 tablet (500 mg total) by mouth 2 (two) times daily with a meal. 06/20/17   Sandra Brents, Roselyn Bering, PA-C  omeprazole (PRILOSEC) 20 MG capsule Take 20 mg by mouth daily.    [provider]  oxyCODONE-acetaminophen (ROXICET) 5-325 MG tablet Take 1 tablet by mouth every 6 (six) hours as needed for moderate pain. 06/07/16   Joni Reining, PA-C  sertraline (ZOLOFT) 100 MG tablet Take 150 mg by mouth daily.    [provider]    Allergies Sulfa antibiotics  No family history on  file.  Social History Social History   Tobacco Use  . Smoking status: Current Every Day Smoker    Packs/day: 1.00    Types: Cigarettes  . Smokeless tobacco: Never Used  Substance Use Topics  . Alcohol use: No  . Drug use: No    Review of Systems  Constitutional: No fever/chills Eyes: No visual changes. ENT: No sore throat. Respiratory: Denies cough Cardiovascular: Denies chest pain Genitourinary: Negative for dysuria. Musculoskeletal: Negative for back pain.  Positive for left leg pain Skin: Negative for rash.    ____________________________________________   PHYSICAL EXAM:  VITAL SIGNS: ED Triage Vitals  Enc Vitals Group     BP 06/20/17 0932 129/90     Pulse Rate 06/20/17 0932 89     Resp 06/20/17 0932 20     Temp 06/20/17 0932 97.9 F (36.6 C)     Temp Source 06/20/17 0932 Oral     SpO2 06/20/17 0932 100 %     Weight 06/20/17 0933 140 lb (63.5 kg)     Height 06/20/17 0933 5\' 3"  (1.6 m)     Head Circumference --      Peak Flow --      Pain Score 06/20/17 0933 9     Pain Loc --      Pain Edu? --  Excl. in GC? --     Constitutional: Alert and oriented. Well appearing and in no acute distress. Eyes: Conjunctivae are normal.  Head: Atraumatic. Nose: No congestion/rhinnorhea. Mouth/Throat: Mucous membranes are moist.   Cardiovascular: Normal rate, regular rhythm.  Heart sounds are normal Respiratory: Normal respiratory effort.  No retractions, lungs are clear to auscultation GU: deferred Musculoskeletal: FROM all extremities, warm and well perfused, there is slight swelling behind the left knee.  The left calf is tender, the left thigh is nontender.  There is no redness noted.  Neurovascular is intact Neurologic:  Normal speech and language.  Skin:  Skin is warm, dry and intact. No rash noted. Psychiatric: Mood and affect are normal. Speech and behavior are normal.  ____________________________________________   LABS (all labs ordered are listed,  but only abnormal results are displayed)  Labs Reviewed - No data to display ____________________________________________   ____________________________________________  RADIOLOGY  Ultrasound of the left lower leg is negative for DVT  ____________________________________________   PROCEDURES  Procedure(s) performed: No  Procedures    ____________________________________________   INITIAL IMPRESSION / ASSESSMENT AND PLAN / ED COURSE  Pertinent labs & imaging results that were available during my care of the patient were reviewed by me and considered in my medical decision making (see chart for details).  Patient is 47 year old female complaining of left leg pain.  She complains of swelling and pain into the calf.  Physical exam the left leg is swollen and tender behind the left knee.  The calf is slightly tender.  The thigh is not tender.  Ultrasound of the left leg is ordered    ----------------------------------------- 12:18 PM on 06/20/2017 -----------------------------------------  Ultrasound of the left lower leg is negative for DVT.  Results were discussed with patient.  She is to take naproxen twice daily.  She is to put ice on the area that hurts.  If she is not better in 3 days she should see her family doctor.  If she is worsening she should return to the emergency department.  Patient states she understands will comply with recommendation.  She was pleased there was not a blood clot in her leg.  She was discharged in stable condition  As part of my medical decision making, I reviewed the following data within the electronic MEDICAL RECORD NUMBER Nursing notes reviewed and incorporated, Old chart reviewed, Radiograph reviewed ultrasound of the left lower leg is negative for clot, Notes from prior ED visits and [place] Controlled Substance Database  ____________________________________________   FINAL CLINICAL IMPRESSION(S) / ED DIAGNOSES  Final diagnoses:  Left leg  pain      NEW MEDICATIONS STARTED DURING THIS VISIT:  New Prescriptions   NAPROXEN (NAPROSYN) 500 MG TABLET    Take 1 tablet (500 mg total) by mouth 2 (two) times daily with a meal.     Note:  This document was prepared using Dragon voice recognition software and may include unintentional dictation errors.    Faythe GheeFisher, Tanis Hensarling W, PA-C 06/20/17 1219    Governor RooksLord, Rebecca, MD 06/20/17 1534

## 2017-06-20 NOTE — ED Triage Notes (Signed)
Pain down back of L leg x 2 days. No edema. Denies injury.

## 2017-06-20 NOTE — Discharge Instructions (Signed)
Apply ice to the area that hurts.  If you are worsening in the area becomes more swollen please return the emergency department.  Follow-up with your doctor if you are not better in 3-5 days.

## 2017-06-20 NOTE — ED Notes (Addendum)
Pt states hx sciatica complications. 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 Minimal to no swelling noted to posterior knee, denies numbness, reports mild tingling. Pain started last Friday and has been progressive. Pain has radiated from knee to upper and lower leg from hip to foot. Pain with weight bearing activities

## 2017-06-20 NOTE — ED Notes (Signed)
ED Provider at bedside. 

## 2017-06-20 NOTE — ED Notes (Signed)

## 2017-06-21 ENCOUNTER — Emergency Department
Admission: EM | Admit: 2017-06-21 | Discharge: 2017-06-21 | Disposition: A | Discharge status: Home / Self Care | Payer: BLUE CROSS/BLUE SHIELD | Attending: Emergency Medicine | Admitting: Emergency Medicine

## 2017-06-21 ENCOUNTER — Other Ambulatory Visit: Payer: Self-pay

## 2017-06-21 DIAGNOSIS — Z79899 Other long term (current) drug therapy: Secondary | ICD-10-CM | POA: Diagnosis not present

## 2017-06-21 DIAGNOSIS — F17210 Nicotine dependence, cigarettes, uncomplicated: Secondary | ICD-10-CM | POA: Diagnosis not present

## 2017-06-21 DIAGNOSIS — I10 Essential (primary) hypertension: Secondary | ICD-10-CM | POA: Insufficient documentation

## 2017-06-21 DIAGNOSIS — M5432 Sciatica, left side: Secondary | ICD-10-CM | POA: Diagnosis not present

## 2017-06-21 DIAGNOSIS — M25562 Pain in left knee: Secondary | ICD-10-CM | POA: Diagnosis present

## 2017-06-21 MED ORDER — DEXAMETHASONE SODIUM PHOSPHATE 10 MG/ML IJ SOLN
10.0000 mg | Freq: Once | INTRAMUSCULAR | Status: AC
  Administered 2017-06-21: 10 mg via INTRAMUSCULAR
  Filled 2017-06-21: qty 1

## 2017-06-21 MED ORDER — PREDNISONE 10 MG PO TABS: ORAL_TABLET | ORAL | 0 refills | Status: DC

## 2017-06-21 NOTE — ED Provider Notes (Signed)
[facility] Emergency Department Provider Note  ___________________________________________   First MD Initiated Contact with Patient 06/21/17 [phone]     (approximate)  I have reviewed the triage vital signs and the nursing notes.   HISTORY  Chief Complaint Knee Pain  HPI Hannah Carey is a 47 y.o. female is a complaint of left knee pain and radiation from her left buttocks down her left posterior leg for last 10 days.  Patient denies any injury.  She was here yesterday where a DVT was ruled out by ultrasound.  Patient was given a prescription for naproxen which she states is not helped after 2 doses.  She states that she has had sciatica in the past.   currently she rates her pain as an 8 out of 10.   Past Medical History:  Diagnosis Date  . Depression   . Hemorrhoids   . Hypertension     There are no active problems to display for this patient.   No past surgical history on file.  Prior to Admission medications   Medication Sig Start Date End Date Taking? Authorizing Provider  amLODipine (NORVASC) 5 MG tablet Take 1 tablet (5 mg total) by mouth daily. 10/24/16 01/22/17  Antonieta IbaGollan, Timothy J, MD  busPIRone (BUSPAR) 7.5 MG tablet Take 7.5 mg by mouth 2 (two) times daily.    [provider]  cetirizine (ZYRTEC) 10 MG tablet Take 10 mg by mouth daily.    [provider]  naproxen (NAPROSYN) 500 MG tablet Take 1 tablet (500 mg total) by mouth 2 (two) times daily with a meal. 06/20/17   Fisher, Roselyn BeringSusan W, PA-C  omeprazole (PRILOSEC) 20 MG capsule Take 20 mg by mouth daily.    [provider]  predniSONE (DELTASONE) 10 MG tablet Take 6 tablets  today, on day 2 take 5 tablets, day 3 take 4 tablets, day 4 take 3 tablets, day 5 take  2 tablets and 1 tablet the last day 06/21/17   Tommi RumpsSummers, Sade Hollon L, PA-C  sertraline (ZOLOFT) 100 MG tablet Take 150 mg by mouth daily.    [provider]    Allergies Sulfa antibiotics  No family  history on file.  Social History Social History   Tobacco Use  . Smoking status: Current Every Day Smoker    Packs/day: 1.00    Types: Cigarettes  . Smokeless tobacco: Never Used  Substance Use Topics  . Alcohol use: No  . Drug use: No    Review of Systems Constitutional: No fever/chills Cardiovascular: Denies chest pain. Respiratory: Denies shortness of breath. Gastrointestinal: No abdominal pain.  No nausea, no vomiting.  Musculoskeletal: Positive for left lower extremity pain. Skin: Negative for rash. Neurological: Negative for headaches, focal weakness or numbness. ____________________________________________   PHYSICAL EXAM:  VITAL SIGNS: ED Triage Vitals  Enc Vitals Group     BP 06/21/17 0631 (!) 159/100     Pulse Rate 06/21/17 0631 86     Resp 06/21/17 0631 18     Temp 06/21/17 0631 98 F (36.7 C)     Temp Source 06/21/17 0631 Oral     SpO2 06/21/17 0631 98 %     Weight 06/21/17 0632 140 lb (63.5 kg)     Height 06/21/17 0632 5\' 3"  (1.6 m)     Head Circumference --      Peak Flow --      Pain Score 06/21/17 0631 8     Pain Loc --  Pain Edu? --      Excl. in GC? --    Constitutional: Alert and oriented. Well appearing and in no acute distress. Eyes: Conjunctivae are normal.  Head: Atraumatic. Neck: No stridor.   Cardiovascular: Normal rate, regular rhythm. Grossly normal heart sounds.  Good peripheral circulation. Respiratory: Normal respiratory effort.  No retractions. Lungs CTAB. Musculoskeletal: On examination of the left lower extremity there is no gross deformity noted.  On examination of the back there no deformity noted.  Patient is nontender to palpation of the lumbar spine.  There is no tenderness on palpation of the right SI joint and surrounding area however there is moderate tenderness on palpation of the left.  Straight leg raises are negative.  Good muscle strength bilaterally.  No edema is noted of the left lower extremity.  No erythema or  warmth.  Skin is intact. Neurologic:  Normal speech and language. No gross focal neurologic deficits are appreciated.  Reflexes are 1+ bilaterally. Skin:  Skin is warm, dry and intact. No rash noted. Psychiatric: Mood and affect are normal. Speech and behavior are normal.    RADIOLOGY  Left lower extremity ultrasound reviewed from yesterday. __________________________________   PROCEDURES  Procedure(s) performed: None  Procedures  Critical Care performed: No  ____________________________________________   INITIAL IMPRESSION / ASSESSMENT AND PLAN / ED COURSE  As part of my medical decision making, I reviewed the following data within the electronic MEDICAL RECORD NUMBER Notes from prior ED visits and [place] Controlled Substance Database  Patient was given Decadron 10 mg IM in the department with a prescription for prednisone 60 mg 6-day taper.  Patient is to follow-up with her PCP at [facility] if any continued problems.  She was also instructed to try ice if needed for discomfort. ____________________________________________   FINAL CLINICAL IMPRESSION(S) / ED DIAGNOSES  Final diagnoses:  Sciatica of left side     ED Discharge Orders        Ordered    predniSONE (DELTASONE) 10 MG tablet     06/21/17 1610       Note:  This document was prepared using Dragon voice recognition software and may include unintentional dictation errors.    Tommi Rumps, PA-C 06/21/17 1044    Arnaldo Natal, MD 06/21/17 [phone]

## 2017-06-21 NOTE — ED Notes (Signed)
See triage note  Presents with left leg pain    States she is having pain that moves from post left knee into calf and up to thigh for about 10 days  Denies any injury  Was seen yesterday  Had u/s and was r/o for dvt    But states pain is worse today  Ambulates well

## 2017-06-21 NOTE — Discharge Instructions (Signed)
[facility] if any [facility] problems.  Begin taking prednisone as directed tapering down over the next 6 days.  You may use ice as needed for discomfort.

## 2017-06-21 NOTE — ED Triage Notes (Signed)
Pt states for 10 days has had left knee pain. Pt states pain is located in lateral knee and posterior knee. Pt is ambulatory without difficulty. Pt states was seen here yesterday for same and received an ultrasound to rule out dvt. Pt states pain persists.

## 2017-06-30 ENCOUNTER — Other Ambulatory Visit: Payer: Self-pay | Admitting: Nurse Practitioner

## 2017-06-30 DIAGNOSIS — Z1231 Encounter for screening mammogram for malignant neoplasm of breast: Secondary | ICD-10-CM

## 2017-09-15 ENCOUNTER — Other Ambulatory Visit: Payer: Self-pay | Admitting: Sports Medicine

## 2017-09-15 DIAGNOSIS — M25562 Pain in left knee: Secondary | ICD-10-CM

## 2017-10-02 ENCOUNTER — Ambulatory Visit
Admission: RE | Admit: 2017-10-02 | Discharge: 2017-10-02 | Disposition: A | Discharge status: Home / Self Care | Payer: BLUE CROSS/BLUE SHIELD | Source: Ambulatory Visit | Attending: Sports Medicine | Admitting: Sports Medicine

## 2017-10-02 DIAGNOSIS — M1712 Unilateral primary osteoarthritis, left knee: Secondary | ICD-10-CM | POA: Insufficient documentation

## 2017-10-02 DIAGNOSIS — M25562 Pain in left knee: Secondary | ICD-10-CM | POA: Diagnosis present

## 2017-10-02 DIAGNOSIS — X58XXXA Exposure to other specified factors, initial encounter: Secondary | ICD-10-CM | POA: Diagnosis not present

## 2017-10-02 DIAGNOSIS — S86112A Strain of other muscle(s) and tendon(s) of posterior muscle group at lower leg level, left leg, initial encounter: Secondary | ICD-10-CM | POA: Insufficient documentation

## 2017-10-05 ENCOUNTER — Other Ambulatory Visit: Payer: Self-pay | Admitting: Cardiovascular Disease

## 2017-11-13 ENCOUNTER — Other Ambulatory Visit: Payer: Self-pay | Admitting: Cardiovascular Disease

## 2017-11-13 ENCOUNTER — Telehealth: Payer: Self-pay | Admitting: Cardiovascular Disease

## 2017-11-13 NOTE — Telephone Encounter (Signed)
Patient declined to schedule at this time and is aware to contact pcp for refills

## 2017-11-13 NOTE — Telephone Encounter (Signed)
-----   Message from Margrett RudBrittany N Slayton, [place] sent at 11/13/2017 12:16 PM EDT ----- Can you please try to schedule an appointment with Dr. Mariah MillingGollan Patient was last seen in 2017 and was told to follow up after testing.  If patient does not want appointment please let them know to contact Primary Care Provider for refills on medication.   Thanks!

## 2018-01-13 ENCOUNTER — Other Ambulatory Visit: Payer: Self-pay | Admitting: Podiatry

## 2018-01-13 DIAGNOSIS — S92244D Nondisplaced fracture of medial cuneiform of right foot, subsequent encounter for fracture with routine healing: Secondary | ICD-10-CM

## 2018-01-21 ENCOUNTER — Ambulatory Visit
Admission: RE | Admit: 2018-01-21 | Discharge: 2018-01-21 | Disposition: A | Discharge status: Home / Self Care | Payer: BLUE CROSS/BLUE SHIELD | Source: Ambulatory Visit | Attending: Podiatry | Admitting: Podiatry

## 2018-01-21 DIAGNOSIS — X58XXXD Exposure to other specified factors, subsequent encounter: Secondary | ICD-10-CM | POA: Diagnosis not present

## 2018-01-21 DIAGNOSIS — S92241D Displaced fracture of medial cuneiform of right foot, subsequent encounter for fracture with routine healing: Secondary | ICD-10-CM | POA: Diagnosis present

## 2018-01-21 DIAGNOSIS — M7989 Other specified soft tissue disorders: Secondary | ICD-10-CM | POA: Diagnosis not present

## 2018-01-21 DIAGNOSIS — S92244D Nondisplaced fracture of medial cuneiform of right foot, subsequent encounter for fracture with routine healing: Secondary | ICD-10-CM

## 2018-03-10 ENCOUNTER — Other Ambulatory Visit: Payer: Self-pay | Admitting: Podiatry

## 2018-03-10 DIAGNOSIS — S92234K Nondisplaced fracture of intermediate cuneiform of right foot, subsequent encounter for fracture with nonunion: Secondary | ICD-10-CM

## 2018-04-08 ENCOUNTER — Ambulatory Visit
Admission: RE | Admit: 2018-04-08 | Discharge: 2018-04-08 | Disposition: A | Discharge status: Home / Self Care | Payer: BLUE CROSS/BLUE SHIELD | Source: Ambulatory Visit | Attending: Podiatry | Admitting: Podiatry

## 2018-04-08 DIAGNOSIS — S92234K Nondisplaced fracture of intermediate cuneiform of right foot, subsequent encounter for fracture with nonunion: Secondary | ICD-10-CM | POA: Diagnosis present

## 2018-04-14 ENCOUNTER — Other Ambulatory Visit: Payer: Self-pay | Admitting: Nurse Practitioner

## 2018-04-14 DIAGNOSIS — Z1231 Encounter for screening mammogram for malignant neoplasm of breast: Secondary | ICD-10-CM

## 2018-08-12 IMAGING — CR DG CERVICAL SPINE 2 OR 3 VIEWS
3 series · 3 of 3 positions shown · non-contrast
Comparison: None.

CLINICAL DATA: Neck and left shoulder pain.  No known injury.

EXAM:
CERVICAL SPINE - 2-3 VIEW

[c-spine lat]
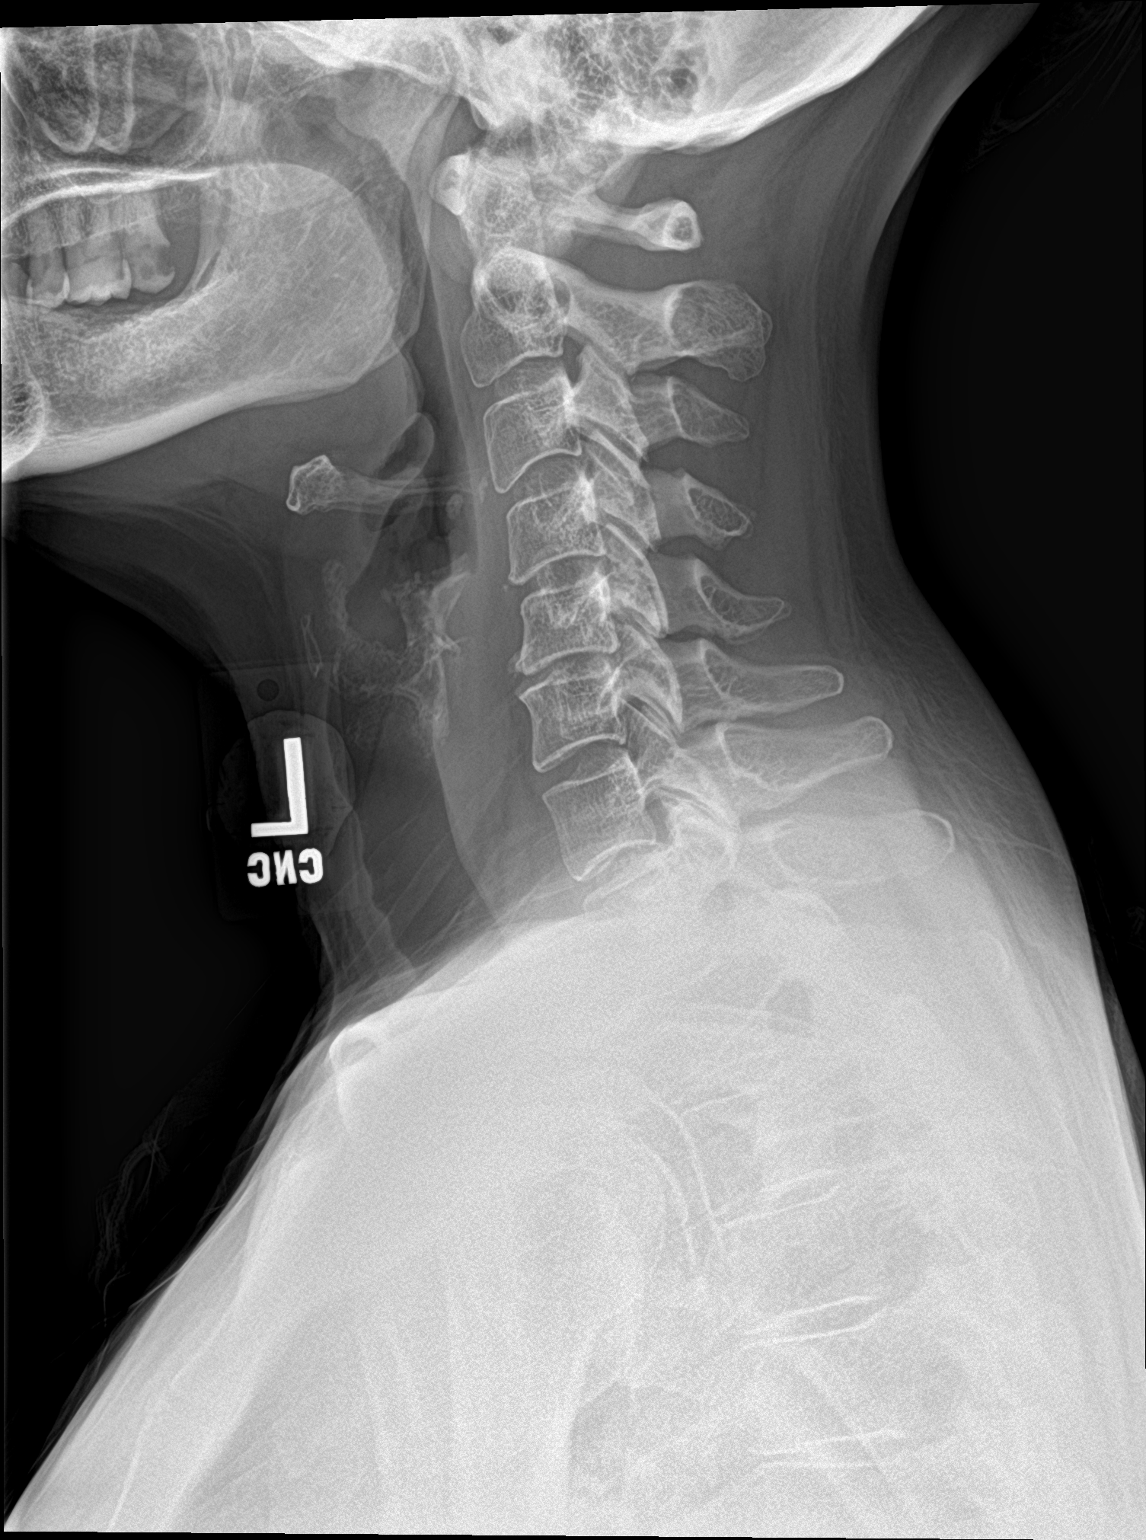

[c-spine ap]
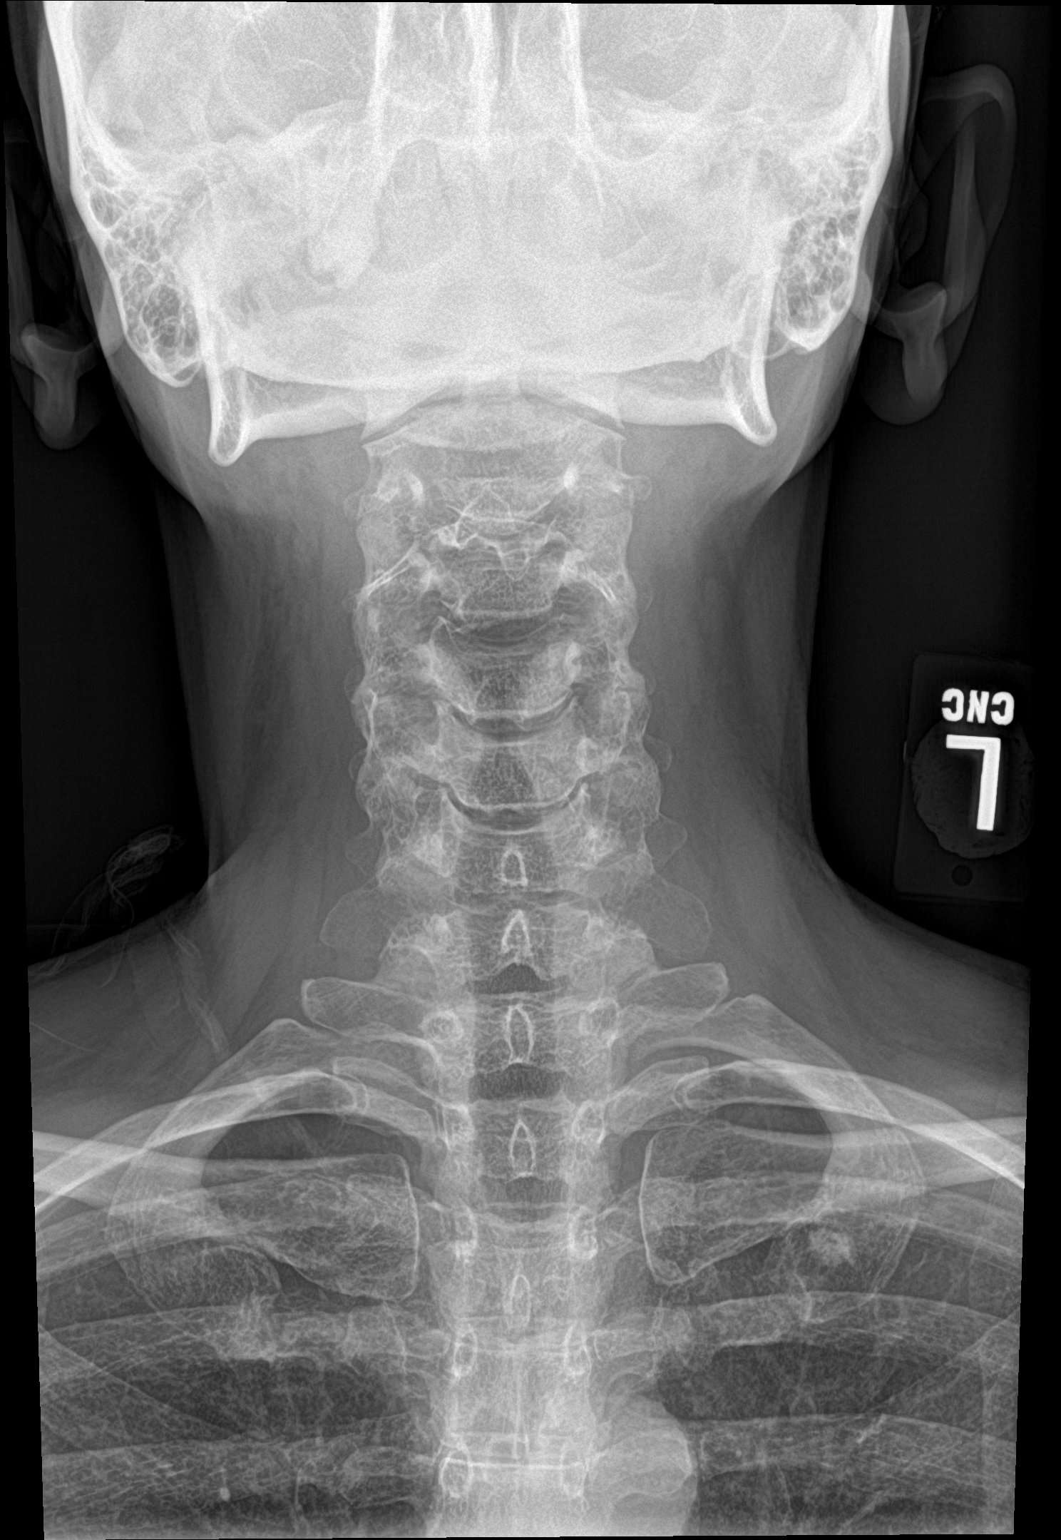

[c-spine open mouth]
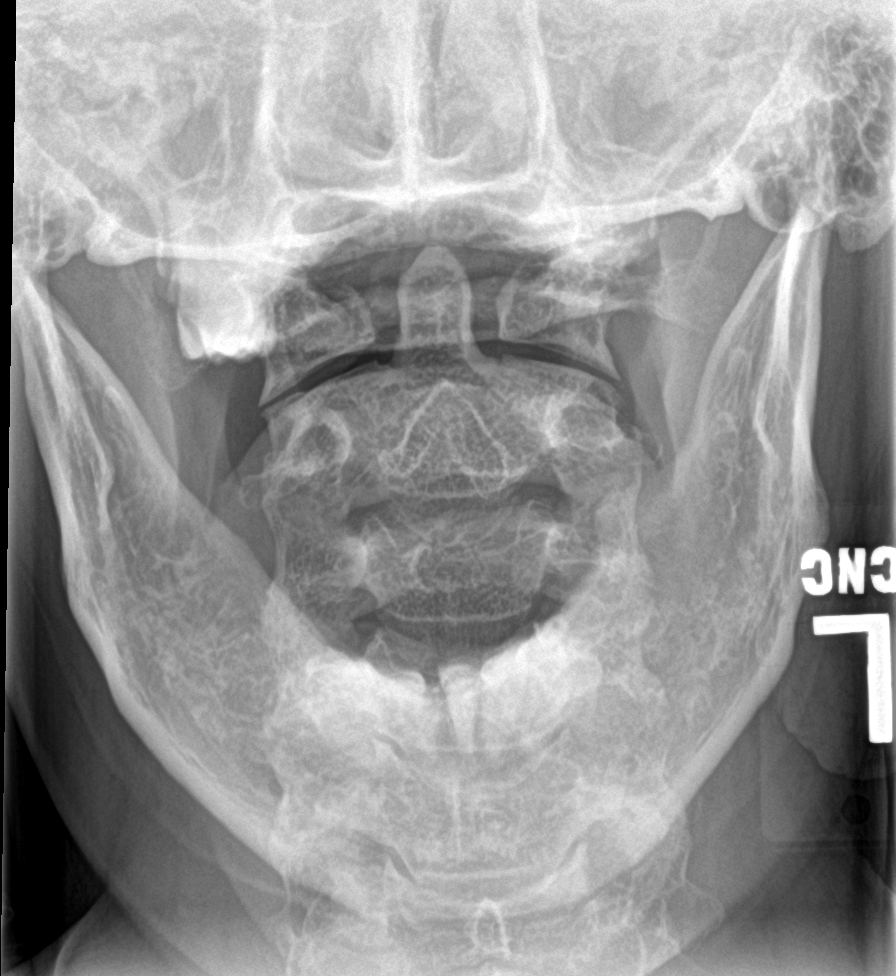

[3 of 3 positions shown; findings below may reference images not displayed]

FINDINGS: C5-6 moderate degenerative disc narrowing with mild endplate
ridging. Slight retrolisthesis at this level. Minimal facet spurring
suspected at C7-T1. No prevertebral thickening or fracture
deformity. No evidence of focal bone lesion.
IMPRESSION: C5-6 disc degeneration with moderate narrowing.

## 2018-08-12 IMAGING — CR DG CHEST 2V
2 series · 2 of 2 positions shown · non-contrast
Comparison: 01/26/2016

CLINICAL DATA: Left posterior chest pain, left neck pain, and left
shoulder pain for 2 weeks. Numbness and tingling in the left arm.
Smoker.

EXAM:
CHEST  2 VIEW

[chest pa]
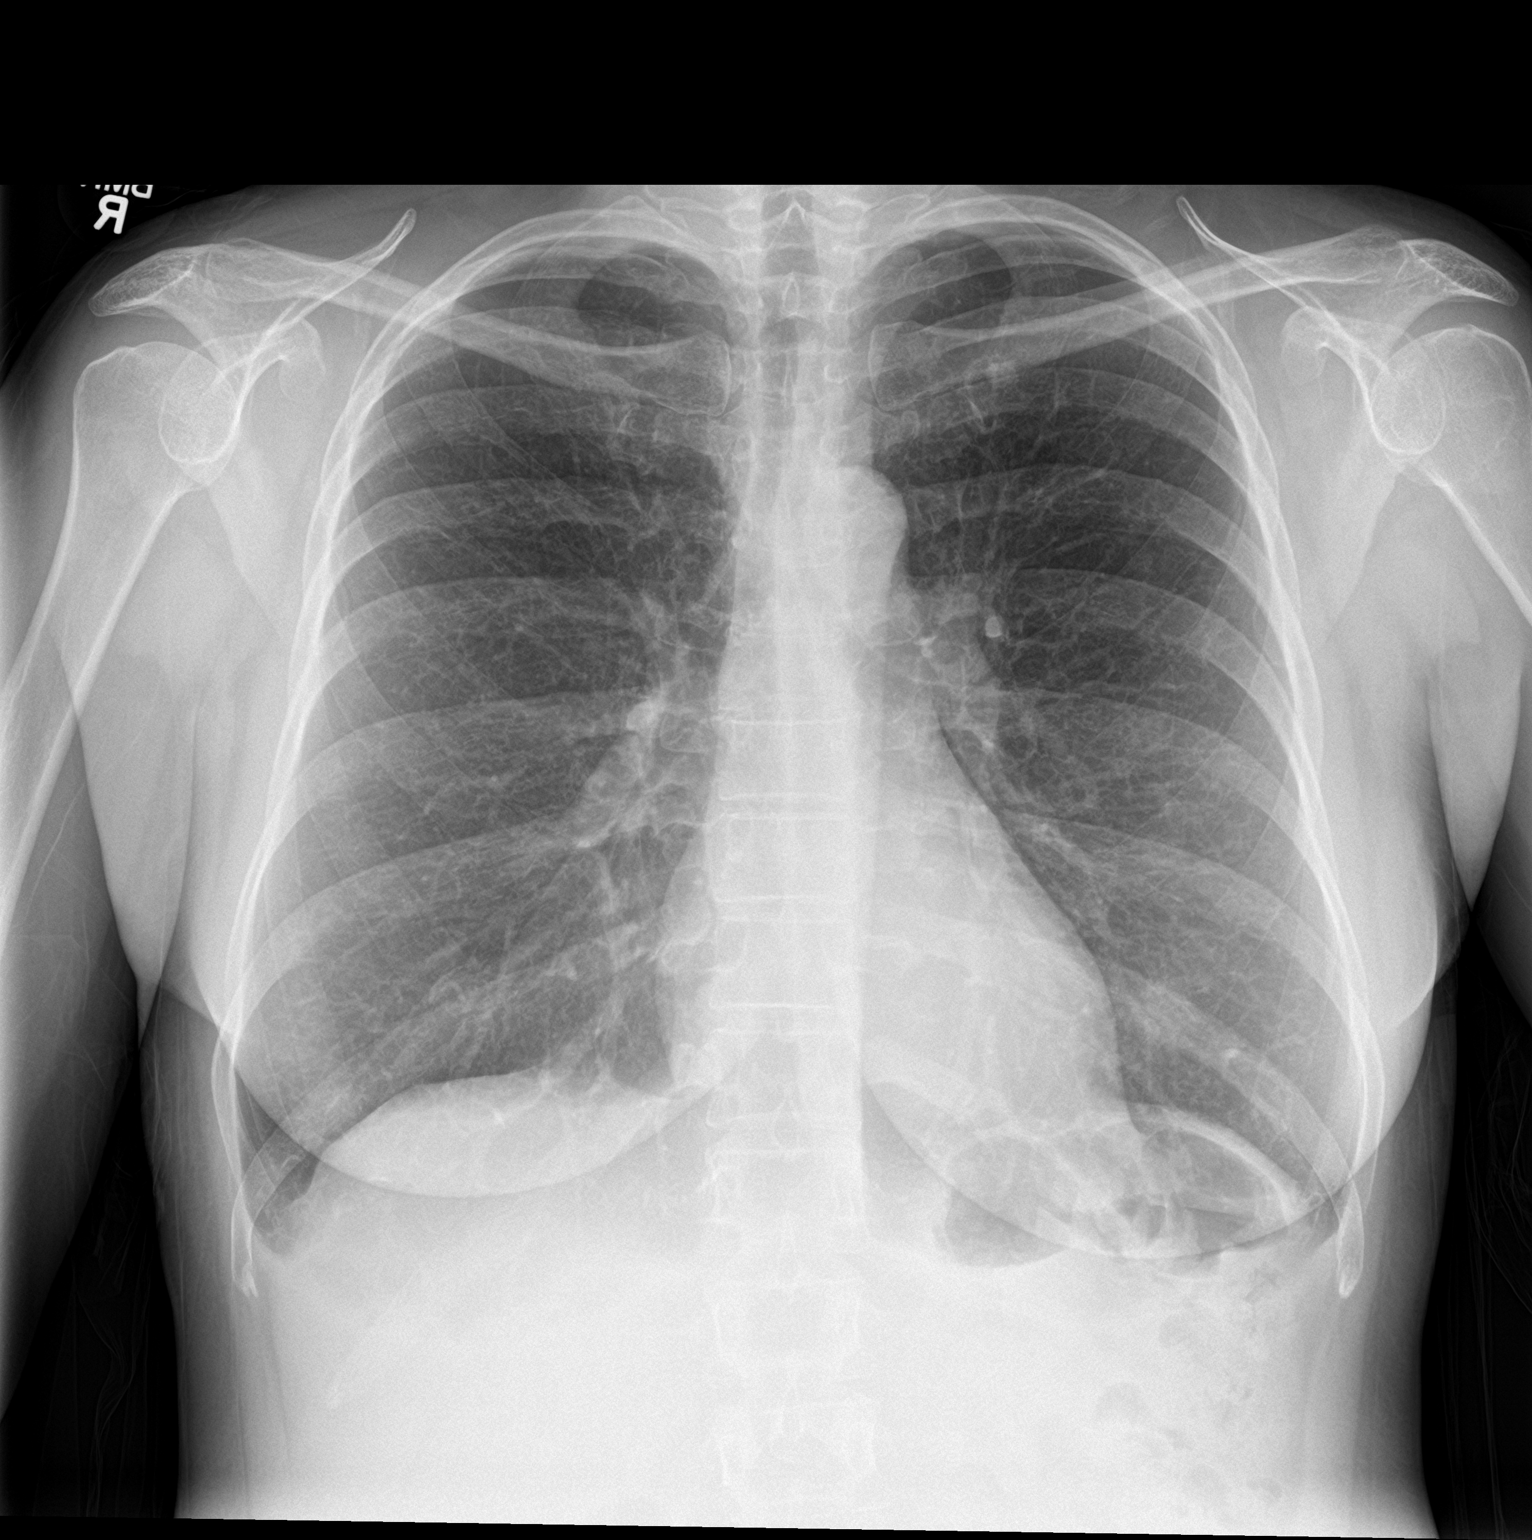

[chest lat]
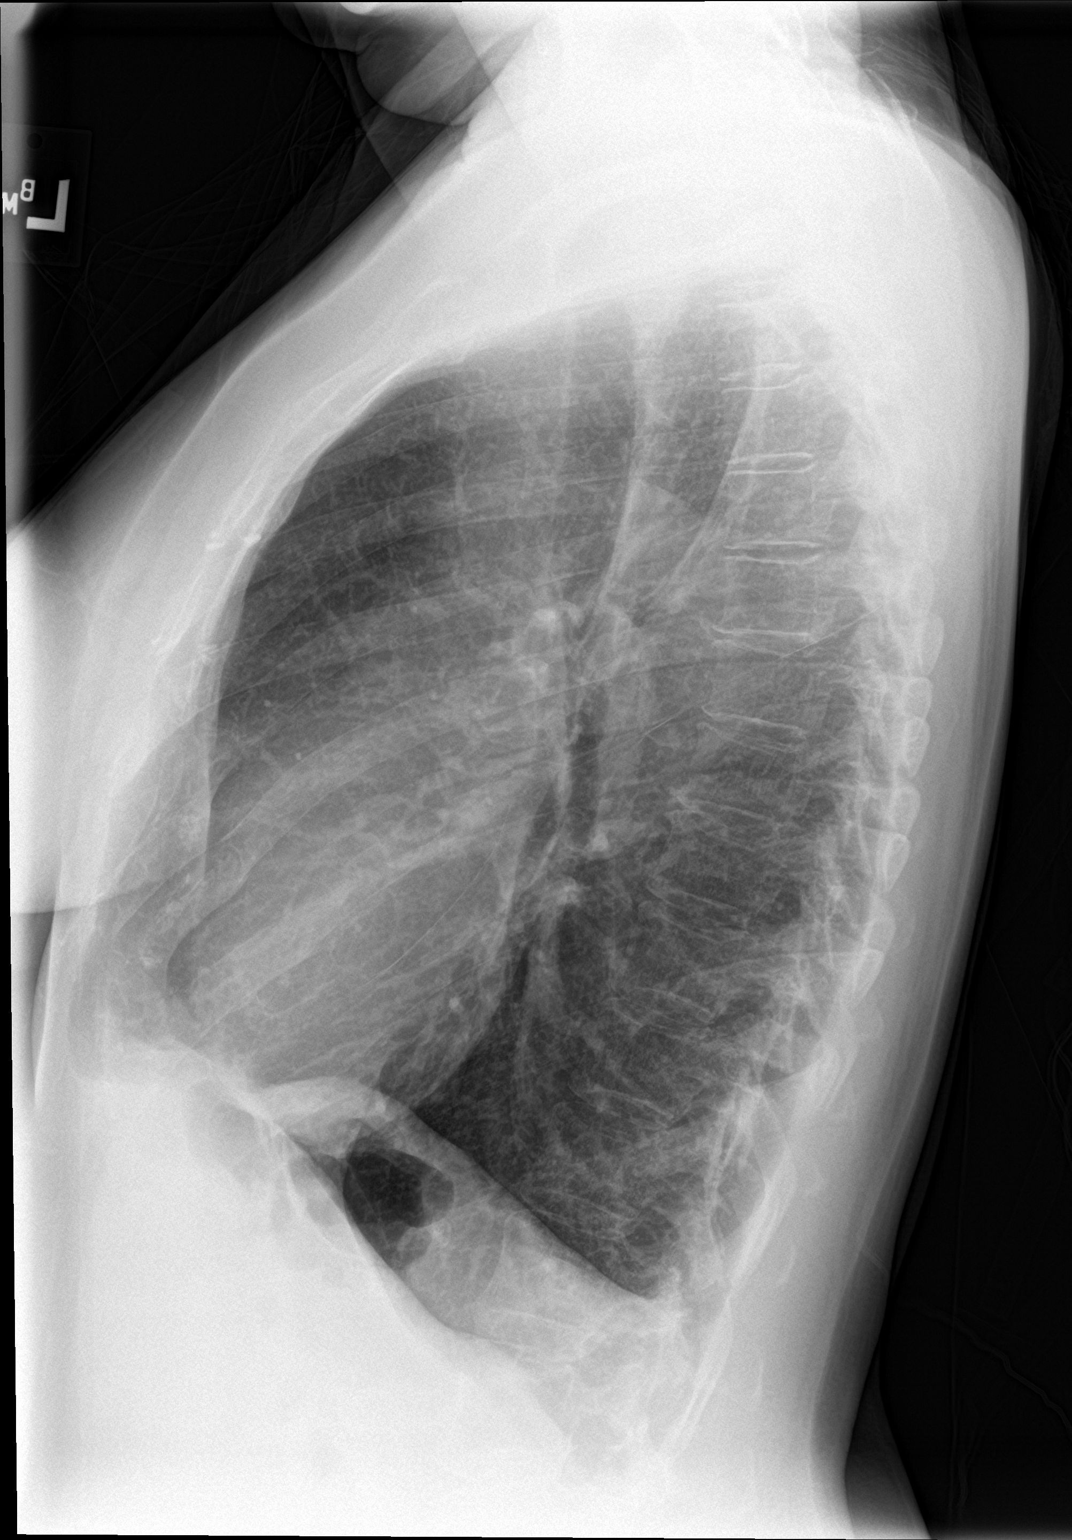

[2 of 2 positions shown; findings below may reference images not displayed]

FINDINGS: Emphysematous changes and fibrosis in the lungs. Normal heart size
and pulmonary vascularity. No focal airspace disease or
consolidation in the lungs. No blunting of costophrenic angles. No
pneumothorax. Mediastinal contours appear intact. Mild degenerative
changes in the spine.
IMPRESSION: No active cardiopulmonary disease.

## 2018-09-07 ENCOUNTER — Emergency Department
Admission: EM | Admit: 2018-09-07 | Discharge: 2018-09-07 | Disposition: A | Discharge status: Home / Self Care | Payer: BLUE CROSS/BLUE SHIELD | Attending: Emergency Medicine | Admitting: Emergency Medicine

## 2018-09-07 ENCOUNTER — Other Ambulatory Visit: Payer: Self-pay

## 2018-09-07 ENCOUNTER — Encounter: Payer: Self-pay | Admitting: Emergency Medicine

## 2018-09-07 DIAGNOSIS — Z79899 Other long term (current) drug therapy: Secondary | ICD-10-CM | POA: Insufficient documentation

## 2018-09-07 DIAGNOSIS — F17210 Nicotine dependence, cigarettes, uncomplicated: Secondary | ICD-10-CM | POA: Diagnosis not present

## 2018-09-07 DIAGNOSIS — I10 Essential (primary) hypertension: Secondary | ICD-10-CM | POA: Diagnosis not present

## 2018-09-07 DIAGNOSIS — R21 Rash and other nonspecific skin eruption: Secondary | ICD-10-CM

## 2018-09-07 DIAGNOSIS — H5711 Ocular pain, right eye: Secondary | ICD-10-CM | POA: Diagnosis present

## 2018-09-07 MED ORDER — ACYCLOVIR 800 MG PO TABS: 800.0000 mg | ORAL_TABLET | Freq: Every day | ORAL | 0 refills | Status: AC

## 2018-09-07 MED ORDER — TETRACAINE HCL 0.5 % OP SOLN
2.0000 [drp] | Freq: Once | OPHTHALMIC | Status: AC
  Administered 2018-09-07: 15:00:00 2 [drp] via OPHTHALMIC
  Filled 2018-09-07: qty 4

## 2018-09-07 MED ORDER — FLUORESCEIN SODIUM 1 MG OP STRP
1.0000 | ORAL_STRIP | Freq: Once | OPHTHALMIC | Status: AC
  Administered 2018-09-07: 1 via OPHTHALMIC
  Filled 2018-09-07: qty 1

## 2018-09-07 NOTE — ED Notes (Signed)
See triage note  States she has had rash to nose and sores to inside of nose  Was seen by PCP  Placed on antibiotics  States area to face is better but is having "pressure" and burning to eyes and nose

## 2018-09-07 NOTE — ED Provider Notes (Signed)
[facility] Emergency Department Provider Note  ____________________________________________  Time seen: Approximately 3:34 PM  I have reviewed the triage vital signs and the nursing notes.   HISTORY  Chief Complaint Eye Pain and Skin infection in nose    HPI Hannah Carey is a 48 y.o. female that presents to the emergency department for evaluation of pain to inside of nose and eye pain, primarily on the right for 1 week. Patient states that when symptoms started, he had itching to the inside of her right nose. The following day, the inside of her right nostril burned.  She also had a rash to the top of her nose but this has resolved.  Occasionally she would get shooting pains to the top of her right cheek.  It seems that she has had burning to both of her eyes but most of the pain is on the right.  She does not have any left eye pain currently but thinks there have been a couple episodes where her left eye burned as well. Pain has not worsened but not improved either.  She had a telemetry visit with her primary care provider and was given a prescription for amoxicillin, topical antibiotic and valacyclovir.  She had to discontinue the valacyclovir because it did not make her feel well.  She had chickenpox as a child.  No fever, headache, visual changes.   Past Medical History:  Diagnosis Date  . Depression   . Hemorrhoids   . Hypertension     There are no active problems to display for this patient.   History reviewed. No pertinent surgical history.  Prior to Admission medications   Medication Sig Start Date End Date Taking? Authorizing Provider  acyclovir (ZOVIRAX) 800 MG tablet Take 1 tablet (800 mg total) by mouth 5 (five) times daily for 7 days. 09/07/18 09/14/18  Enid Derry, PA-C  amLODipine (NORVASC) 5 MG tablet Take 1 tablet (5 mg total) by mouth daily. 10/24/16 01/22/17  Antonieta Iba, MD  busPIRone (BUSPAR) 7.5 MG tablet Take 7.5 mg by mouth 2  (two) times daily.    [provider]  cetirizine (ZYRTEC) 10 MG tablet Take 10 mg by mouth daily.    [provider]  omeprazole (PRILOSEC) 20 MG capsule Take 20 mg by mouth daily.    [provider]  sertraline (ZOLOFT) 100 MG tablet Take 150 mg by mouth daily.    [provider]    Allergies Doxycycline and Sulfa antibiotics  No family history on file.  Social History Social History   Tobacco Use  . Smoking status: Current Every Day Smoker    Packs/day: 1.00    Types: Cigarettes  . Smokeless tobacco: Never Used  Substance Use Topics  . Alcohol use: No  . Drug use: No     Review of Systems  Constitutional: No fever/chills ENT: Positive for nose pain Respiratory: No SOB. Gastrointestinal: No nausea, no vomiting.  Musculoskeletal: Negative for musculoskeletal pain. Skin: Negative for  lacerations, ecchymosis. Neurological: Negative for headaches   ____________________________________________   PHYSICAL EXAM:  VITAL SIGNS: ED Triage Vitals  Enc Vitals Group     BP 09/07/18 1407 (!) 141/87     Pulse Rate 09/07/18 1407 90     Resp 09/07/18 1407 16     Temp 09/07/18 1407 98.9 F (37.2 C)     Temp Source 09/07/18 1407 Oral     SpO2 09/07/18 1407 100 %     Weight 09/07/18 1408  150 lb (68 kg)     Height 09/07/18 1408 5\' 3"  (1.6 m)     Head Circumference --      Peak Flow --      Pain Score --      Pain Loc --      Pain Edu? --      Excl. in GC? --      Constitutional: Alert and oriented. Well appearing and in no acute distress. Eyes: Conjunctivae are normal. PERRL. EOMI. no lesion on fluorescein stain. Head: Atraumatic. ENT:      Ears:      Nose: No congestion/rhinnorhea. Faint vesicles/papules to medial right nostril.  No external rash.      Mouth/Throat: Mucous membranes are moist.  Neck: No stridor. Cardiovascular: Normal rate, regular rhythm.  Good peripheral circulation. Respiratory: Normal respiratory effort  without tachypnea or retractions. Lungs CTAB. Good air entry to the bases with no decreased or absent breath sounds.  Musculoskeletal: Full range of motion to all extremities. No gross deformities appreciated. Neurologic:  Normal speech and language. No gross focal neurologic deficits are appreciated.  Skin:  Skin is warm, dry and intact. No rash noted. Psychiatric: Mood and affect are normal. Speech and behavior are normal. Patient exhibits appropriate insight and judgement.   ____________________________________________   LABS (all labs ordered are listed, but only abnormal results are displayed)  Labs Reviewed - No data to display ____________________________________________  EKG   ____________________________________________  RADIOLOGY   No results found.  ____________________________________________    PROCEDURES  Procedure(s) performed:    Procedures    Medications  tetracaine (PONTOCAINE) 0.5 % ophthalmic solution 2 drop (2 drops Right Eye Given 09/07/18 1443)  fluorescein ophthalmic strip 1 strip (1 strip Right Eye Given 09/07/18 1443)     ____________________________________________   INITIAL IMPRESSION / ASSESSMENT AND PLAN / ED COURSE  Pertinent labs & imaging results that were available during my care of the patient were reviewed by me and considered in my medical decision making (see chart for details).  Review of the [place] CSRS was performed in accordance of the [facility] prior to dispensing any controlled drugs.     Patient presented to the emergency department for evaluation of pain to nose and eye pain for 1 week.  Patient does admit to some bilateral eye pain but it is very infrequent on the left and it is most consistent on the right.  Symptoms do sound consistent with herpes zoster.  Patient was started on valacyclovir but did not tolerate the medication.  She will try a course of acyclovir.  She is not having any visual changes.  No defect on  fluorescein stain.  She will continue her amoxicillin and topical antibiotic.  She will also continue taking her daily Zyrtec.  Patient will be discharged home with prescriptions for acyclovir. Patient is to follow up with ophthalmology and primary care as directed.  Patient was given referral to Dr. Brooke Dare.  Patient is given ED precautions to return to the ED for any worsening or new symptoms.     ____________________________________________  FINAL CLINICAL IMPRESSION(S) / ED DIAGNOSES  Final diagnoses:  Rash      NEW MEDICATIONS STARTED DURING THIS VISIT:  ED Discharge Orders         Ordered    acyclovir (ZOVIRAX) 800 MG tablet  5 times daily     09/07/18 1527              This chart was dictated using  voice recognition software/Dragon. Despite best efforts to proofread, errors can occur which can change the meaning. Any change was purely unintentional.    Enid DerryWagner, Maleek Craver, PA-C 09/07/18 1551    Jene EveryKinner, Robert, MD 09/08/18 [phone]

## 2018-09-07 NOTE — Discharge Instructions (Addendum)
I am concerned that your rash could be shingles.  Please discontinue valacyclovir since you did not feel well on it and begin acyclovir.  Please follow-up with primary care this week to ensure that you are doing okay on the acyclovir and for further management.  Shingles can go to your eyes so please call ophthalmology for an appointment as well.  Finish taking your prescription for amoxicillin and the topical antibiotic.  Continue taking Zyrtec for allergies.

## 2018-09-07 NOTE — ED Triage Notes (Signed)
Pt here with c/o skin infection in nose that won't go away, said she was seen by tele MD and the med she was prescribed didn't help the "rash" on the inside of there nose. Pt also states bilateral eye "burning" as well. NAD.

## 2018-12-14 ENCOUNTER — Other Ambulatory Visit: Payer: Self-pay

## 2018-12-14 DIAGNOSIS — Z20822 Contact with and (suspected) exposure to covid-19: Secondary | ICD-10-CM

## 2018-12-15 LAB — NOVEL CORONAVIRUS, NAA: SARS-CoV-2, NAA: NOT DETECTED

## 2019-05-12 ENCOUNTER — Ambulatory Visit: Payer: Self-pay | Attending: Internal Medicine

## 2019-05-12 DIAGNOSIS — Z20822 Contact with and (suspected) exposure to covid-19: Secondary | ICD-10-CM

## 2019-05-14 LAB — NOVEL CORONAVIRUS, NAA: SARS-CoV-2, NAA: NOT DETECTED

## 2019-05-22 ENCOUNTER — Other Ambulatory Visit: Payer: Self-pay

## 2019-05-22 ENCOUNTER — Emergency Department
Admission: EM | Admit: 2019-05-22 | Discharge: 2019-05-22 | Disposition: A | Discharge status: Home / Self Care | Payer: Self-pay | Attending: Emergency Medicine | Admitting: Emergency Medicine

## 2019-05-22 DIAGNOSIS — F17210 Nicotine dependence, cigarettes, uncomplicated: Secondary | ICD-10-CM | POA: Insufficient documentation

## 2019-05-22 DIAGNOSIS — Z79899 Other long term (current) drug therapy: Secondary | ICD-10-CM | POA: Insufficient documentation

## 2019-05-22 DIAGNOSIS — R0981 Nasal congestion: Secondary | ICD-10-CM | POA: Insufficient documentation

## 2019-05-22 DIAGNOSIS — I10 Essential (primary) hypertension: Secondary | ICD-10-CM | POA: Insufficient documentation

## 2019-05-22 MED ORDER — OXYMETAZOLINE HCL 0.05 % NA SOLN
1.0000 | Freq: Once | NASAL | Status: AC
  Administered 2019-05-22: 1 via NASAL
  Filled 2019-05-22: qty 30

## 2019-05-22 NOTE — ED Triage Notes (Signed)
Pt arrives from home via POV with complaint of nasal congestion since Monday. Pt reports that approx 2 weeks ago she developed a headache and flu-like symptoms, which resolved in about 3 days. Pt reports being tested one week ago with negative result. Pt reports no other symptoms, NAD at this time

## 2019-05-22 NOTE — ED Provider Notes (Signed)
[facility] Emergency Department Provider Note  Time seen: 4:38 AM  I have reviewed the triage vital signs and the nursing notes.   HISTORY  Chief Complaint Nasal Congestion   HPI Hannah Carey is a 49 y.o. female with a past medical history of hypertension, presents to the emergency department for right nasal and sinus pain.  According to the patient on Tuesday approximately 5 days ago patient developed sinus drainage nasal drainage and pain in her sinus.  Patient states she saw her doctor on Wednesday through an online visit and was prescribed amoxicillin for possible sinus infection.  Patient states she has been taking the antibiotic but continues to have significant nasal congestion on the right side and some pain in her sinus.  Denies any fever shortness of breath.  Past Medical History:  Diagnosis Date  . Depression   . Hemorrhoids   . Hypertension     There are no problems to display for this patient.   No past surgical history on file.  Prior to Admission medications   Medication Sig Start Date End Date Taking? Authorizing Provider  amLODipine (NORVASC) 5 MG tablet Take 1 tablet (5 mg total) by mouth daily. 10/24/16 01/22/17  Antonieta Iba, MD  busPIRone (BUSPAR) 7.5 MG tablet Take 7.5 mg by mouth 2 (two) times daily.    [provider]  cetirizine (ZYRTEC) 10 MG tablet Take 10 mg by mouth daily.    [provider]  omeprazole (PRILOSEC) 20 MG capsule Take 20 mg by mouth daily.    [provider]  sertraline (ZOLOFT) 100 MG tablet Take 150 mg by mouth daily.    [provider]    Allergies  Allergen Reactions  . Doxycycline Nausea And Vomiting  . Sulfa Antibiotics Nausea Only    No family history on file.  Social History Social History   Tobacco Use  . Smoking status: Current Every Day Smoker    Packs/day: 1.00    Types: Cigarettes  . Smokeless tobacco: Never Used  Substance Use Topics  . Alcohol  use: No  . Drug use: No    Review of Systems Constitutional: Negative for fever. ENT: Right maxillary sinus congestion nasal drainage Cardiovascular: Negative for chest pain. Respiratory: Negative for shortness of breath. Gastrointestinal: Negative for abdominal pain Musculoskeletal: Negative for musculoskeletal complaints Neurological: Negative for headache All other ROS negative  ____________________________________________   PHYSICAL EXAM:  VITAL SIGNS: ED Triage Vitals  Enc Vitals Group     BP 05/22/19 0249 (!) 160/87     Pulse Rate 05/22/19 0249 81     Resp 05/22/19 0249 16     Temp 05/22/19 0249 97.9 F (36.6 C)     Temp Source 05/22/19 0249 Oral     SpO2 05/22/19 0249 100 %     Weight 05/22/19 0250 150 lb (68 kg)     Height 05/22/19 0250 5\' 3"  (1.6 m)     Head Circumference --      Peak Flow --      Pain Score 05/22/19 0249 5     Pain Loc --      Pain Edu? --      Excl. in GC? --    Constitutional: Alert and oriented. Well appearing and in no distress. Eyes: Normal exam ENT      Head: Normocephalic and atraumatic.      Nose: Mildly inflamed nasal turbinates in the right nostril.  No lesions.  No significant sinus  tenderness to percussion.      Mouth/Throat: Mucous membranes are moist. Cardiovascular: Normal rate, regular rhythm. No murmur Respiratory: Normal respiratory effort without tachypnea nor retractions.  Mild expiratory wheeze bilaterally. Gastrointestinal: Soft and nontender. No distention.  Musculoskeletal: Nontender with normal range of motion in all extremities.  Neurologic:  Normal speech and language. No gross focal neurologic deficits Skin:  Skin is warm, dry and intact.  Psychiatric: Mood and affect are normal.   ____________________________________________   INITIAL IMPRESSION / ASSESSMENT AND PLAN / ED COURSE  Pertinent labs & imaging results that were available during my care of the patient were reviewed by me and considered in my  medical decision making (see chart for details).   Patient presents to the emergency department for nasal drainage and sinus congestion.  Overall the patient appears well, she is already taking an antibiotic, she has no tenderness to percussion over the sinuses.  Does have inflamed turbinates in the right nostril and states she feels like her nose and sinus cannot drain.  We will write the patient for Afrin to be used twice daily for the next 3 days I discussed discarding the Afrin after 3 days to avoid rebound congestion.  Patient agreeable to plan of care.  Hannah Carey was evaluated in Emergency Department on 05/22/2019 for the symptoms described in the history of present illness. She was evaluated in the context of the global COVID-19 pandemic, which necessitated consideration that the patient might be at risk for infection with the SARS-CoV-2 virus that causes COVID-19. Institutional protocols and algorithms that pertain to the evaluation of patients at risk for COVID-19 are in a state of rapid change based on information released by regulatory bodies including the CDC and federal and state organizations. These policies and algorithms were followed during the patient's care in the ED.  ____________________________________________   FINAL CLINICAL IMPRESSION(S) / ED DIAGNOSES  Nasal congestion   Harvest Dark, MD 05/22/19 [phone]

## 2019-05-22 NOTE — Discharge Instructions (Addendum)
As we discussed please continue your antibiotic that was prescribed by your doctor.  Please use Afrin twice daily for the next 3 days.  Discard after 3 days.  Please follow-up with your doctor for recheck/reevaluation in the next 3 to 4 days.  Return to the emergency department for any symptom personally concerning to yourself.

## 2019-08-08 ENCOUNTER — Other Ambulatory Visit: Payer: Self-pay

## 2019-08-08 ENCOUNTER — Ambulatory Visit: Payer: Self-pay | Attending: Internal Medicine

## 2019-08-08 DIAGNOSIS — Z23 Encounter for immunization: Secondary | ICD-10-CM

## 2019-08-08 NOTE — Progress Notes (Signed)
   Covid-19 Vaccination Clinic  Name:  ABILENE MCPHEE    MRN: [id number] DOB: November 16, 1970  08/08/2019  Ms. Dungee was observed post Covid-19 immunization for 15 minutes without incident. She was provided with Vaccine Information Sheet and instruction to access the V-Safe system.   Ms. Whack was instructed to call 911 with any severe reactions post vaccine: Marland Kitchen Difficulty breathing  . Swelling of face and throat  . A fast heartbeat  . A bad rash all over body  . Dizziness and weakness   Immunizations Administered    Name Date Dose VIS Date Route   Pfizer COVID-19 Vaccine 08/08/2019 11:40 AM 0.3 mL 04/15/2019 Intramuscular   Manufacturer: [employer], [employer]   Lot: [id number]   NDC: [address]-8

## 2019-08-30 ENCOUNTER — Ambulatory Visit: Payer: Self-pay | Attending: Internal Medicine

## 2019-08-30 DIAGNOSIS — Z23 Encounter for immunization: Secondary | ICD-10-CM

## 2019-08-30 NOTE — Progress Notes (Signed)
   Covid-19 Vaccination Clinic  Name:  Hannah Carey    MRN: [id number] DOB: 15-Jun-1970  08/30/2019  Hannah Carey was observed post Covid-19 immunization for 15 minutes without incident. She was provided with Vaccine Information Sheet and instruction to access the V-Safe system.   Hannah Carey was instructed to call 911 with any severe reactions post vaccine: Marland Kitchen Difficulty breathing  . Swelling of face and throat  . A fast heartbeat  . A bad rash all over body  . Dizziness and weakness   Immunizations Administered    Name Date Dose VIS Date Route   Pfizer COVID-19 Vaccine 08/30/2019  3:51 PM 0.3 mL 06/29/2018 Intramuscular   Manufacturer: [employer], [employer]   Lot: [id number]   NDC: [address]-9

## 2019-09-12 ENCOUNTER — Ambulatory Visit: Payer: HRSA Program | Attending: Internal Medicine

## 2019-09-12 DIAGNOSIS — Z20822 Contact with and (suspected) exposure to covid-19: Secondary | ICD-10-CM

## 2019-09-13 LAB — NOVEL CORONAVIRUS, NAA: SARS-CoV-2, NAA: NOT DETECTED

## 2019-09-13 LAB — SARS-COV-2, NAA 2 DAY TAT

## 2019-12-25 IMAGING — US US EXTREM LOW VENOUS*L*
1 series · 13 of 24 positions shown · non-contrast
Comparison: None.

CLINICAL DATA: Pain and swelling behind the knee.



[Series 1: us extrem low venous*left* · 0.07mm/px · 13 of 34 slices shown]
[im 1/34]
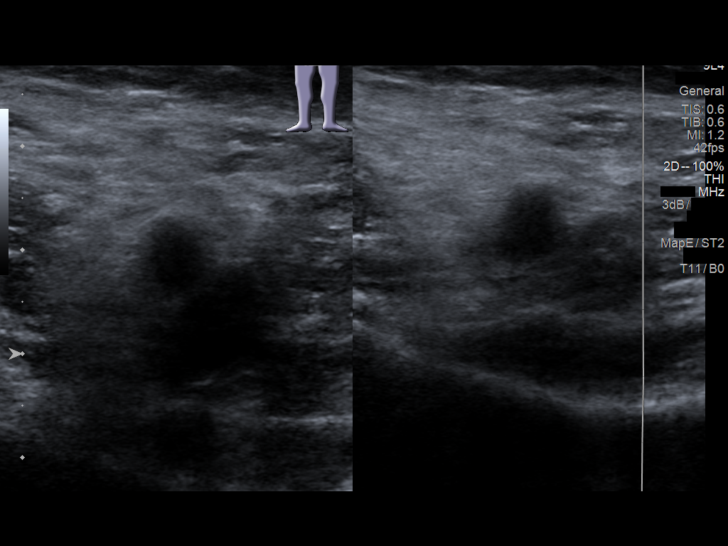
[im 3/34]
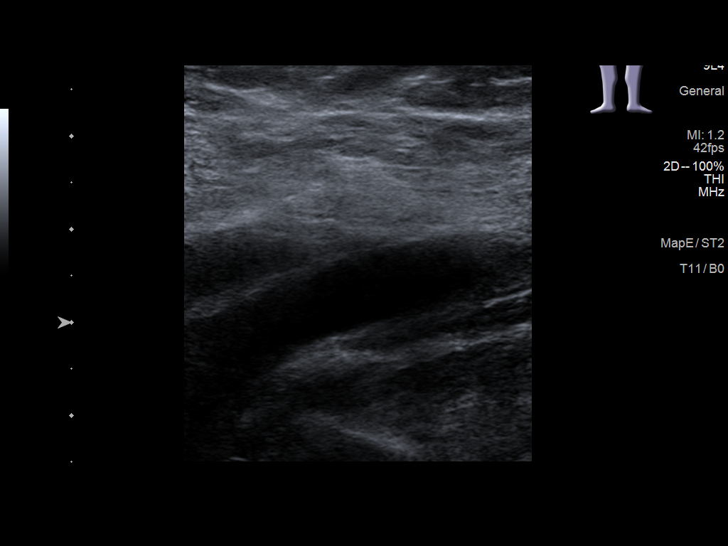
[im 6/34]
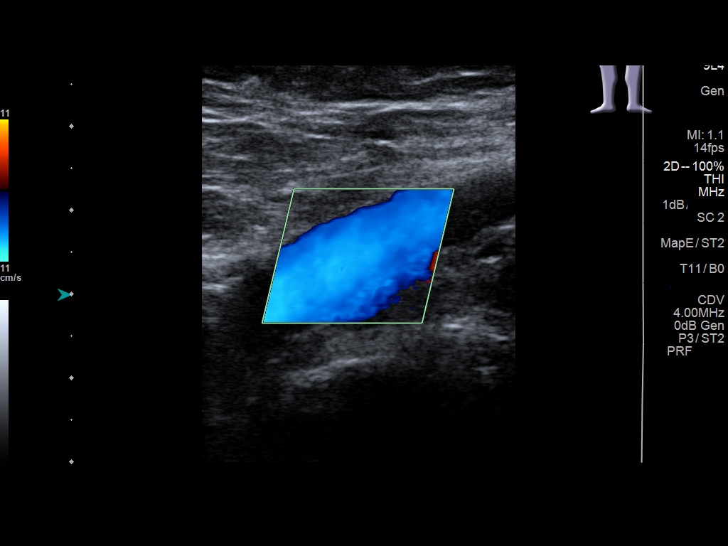
[im 9/34]
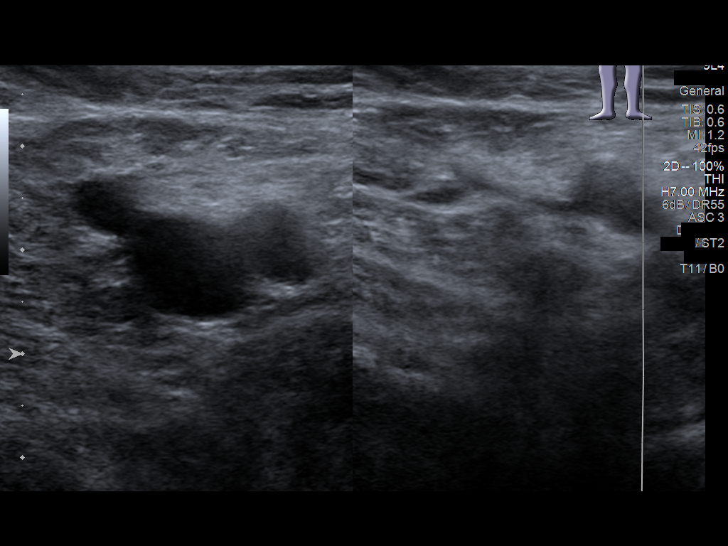
[im 12/34]
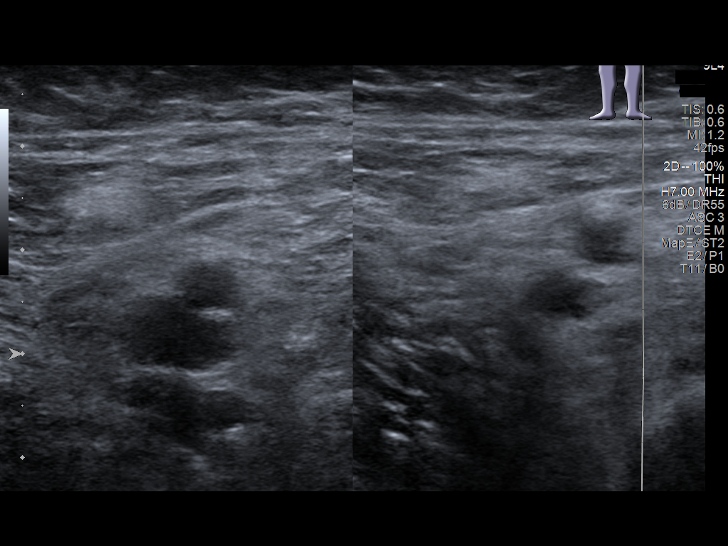
[im 15/34]
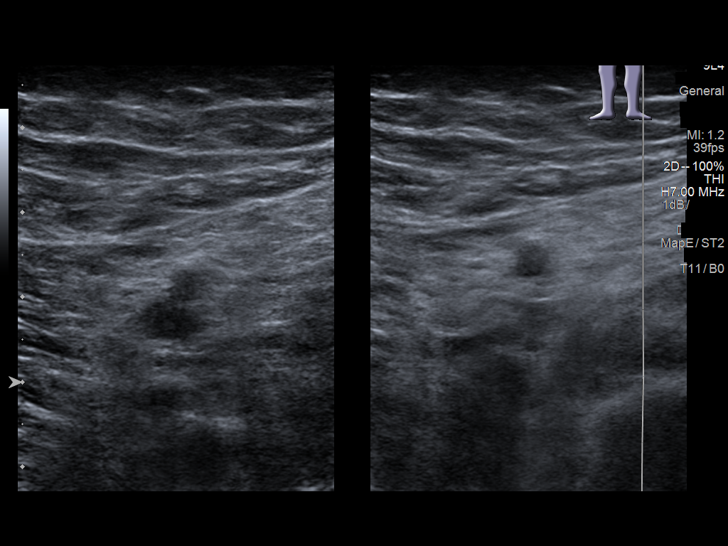
[im 18/34]
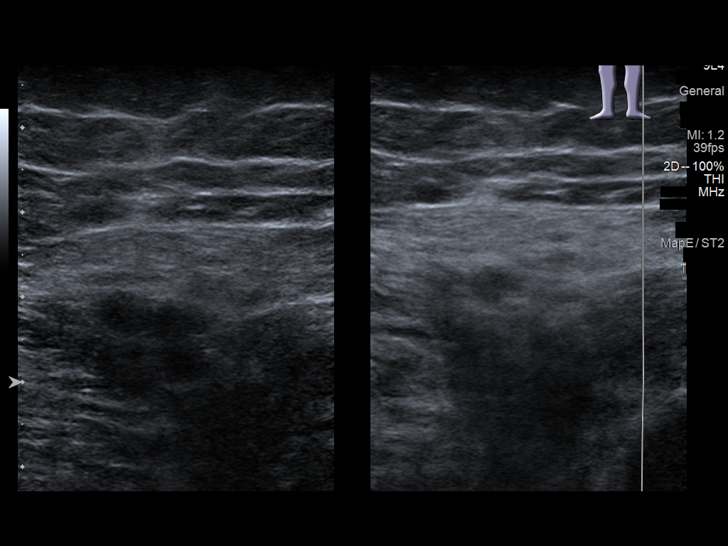
[im 19/34]
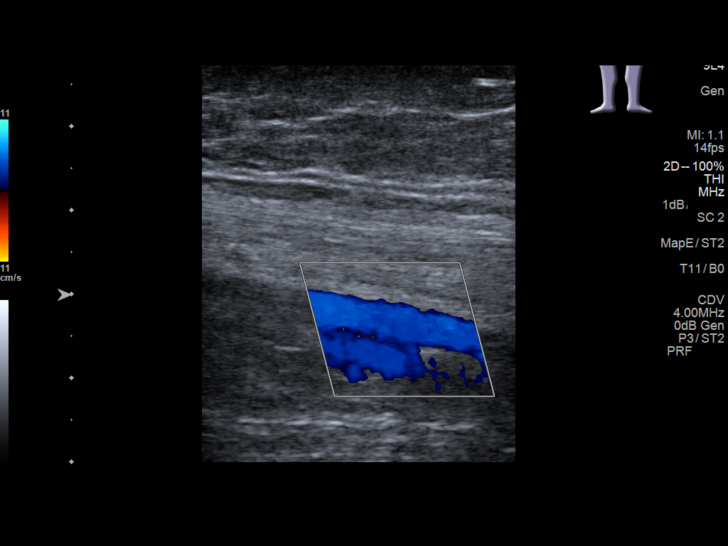
[im 22/34]
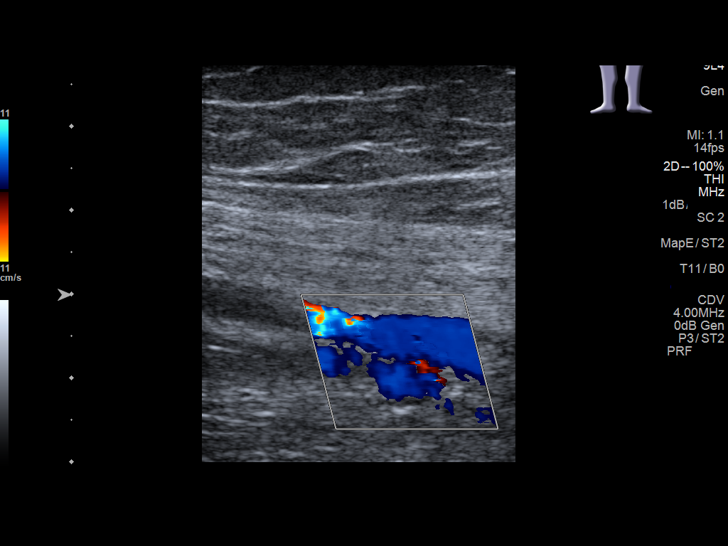
[im 25/34]
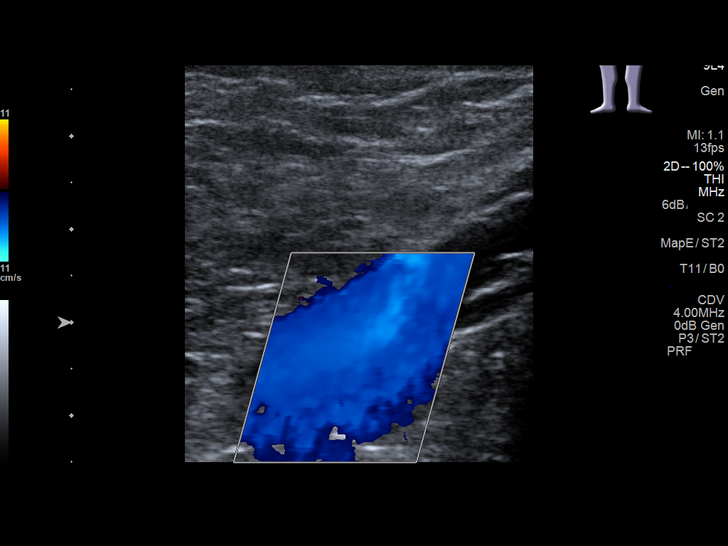
[im 28/34]
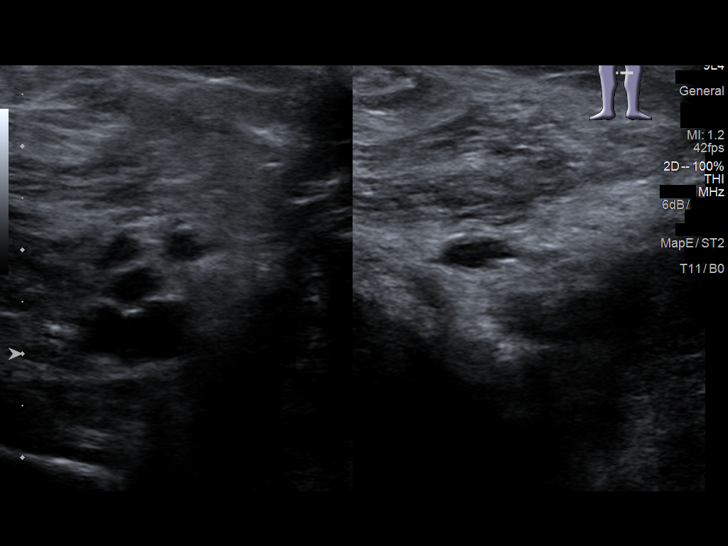
[im 31/34]
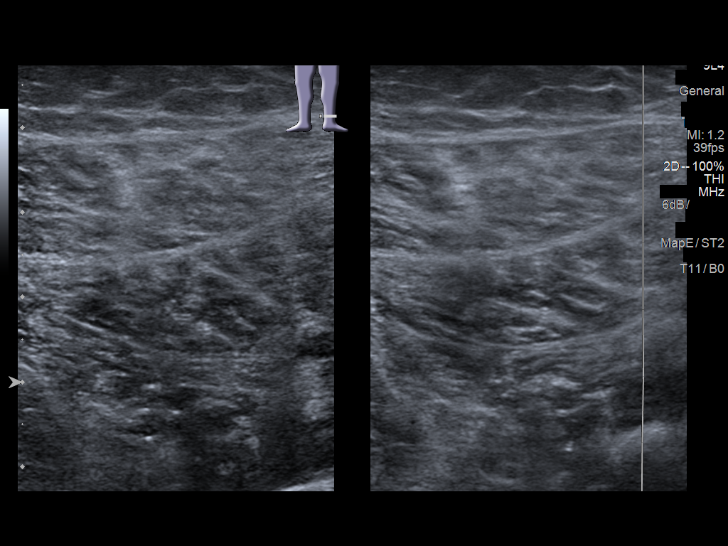
[im 34/34]
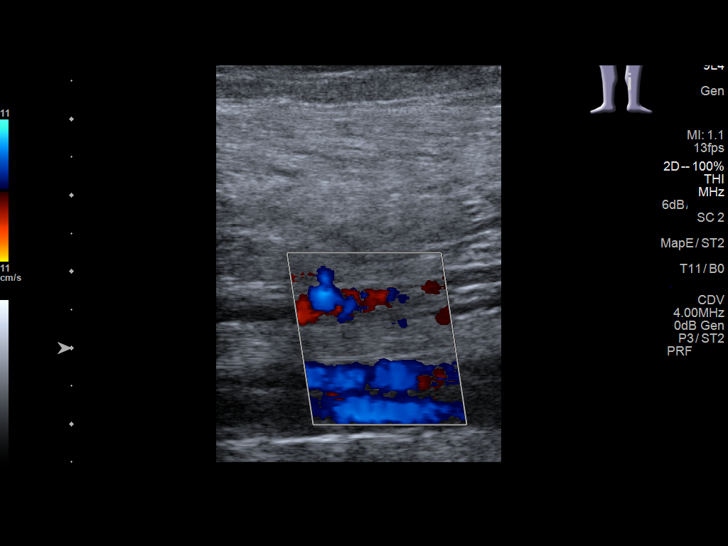

[13 of 24 positions shown; findings below may reference images not displayed]

FINDINGS: Contralateral Common Femoral Vein: Respiratory phasicity is normal
and symmetric with the symptomatic side. No evidence of thrombus.
Normal compressibility.

Common Femoral Vein: No evidence of thrombus. Normal
compressibility, respiratory phasicity and response to augmentation.

Saphenofemoral Junction: No evidence of thrombus. Normal
compressibility and flow on color Doppler imaging.

Profunda Femoral Vein: No evidence of thrombus. Normal
compressibility and flow on color Doppler imaging.

Femoral Vein: No evidence of thrombus. Normal compressibility,
respiratory phasicity and response to augmentation.

Popliteal Vein: No evidence of thrombus. Normal compressibility,
respiratory phasicity and response to augmentation.

Calf Veins: No evidence of thrombus. Normal compressibility and flow
on color Doppler imaging.

Superficial Great Saphenous Vein: No evidence of thrombus. Normal
compressibility.

Venous Reflux:  None.

Other Findings:  None.
IMPRESSION: No evidence of deep venous thrombosis in the left lower extremity.

## 2023-02-05 ENCOUNTER — Other Ambulatory Visit: Payer: Self-pay | Admitting: Family Medicine

## 2023-02-05 DIAGNOSIS — Z1231 Encounter for screening mammogram for malignant neoplasm of breast: Secondary | ICD-10-CM

## 2023-10-15 ENCOUNTER — Emergency Department: Payer: Self-pay

## 2023-10-15 ENCOUNTER — Other Ambulatory Visit: Payer: Self-pay

## 2023-10-15 ENCOUNTER — Emergency Department
Admission: EM | Admit: 2023-10-15 | Discharge: 2023-10-15 | Disposition: A | Discharge status: Home / Self Care | Payer: Self-pay | Attending: Emergency Medicine | Admitting: Emergency Medicine

## 2023-10-15 DIAGNOSIS — E876 Hypokalemia: Secondary | ICD-10-CM | POA: Insufficient documentation

## 2023-10-15 DIAGNOSIS — R509 Fever, unspecified: Secondary | ICD-10-CM | POA: Insufficient documentation

## 2023-10-15 DIAGNOSIS — M546 Pain in thoracic spine: Secondary | ICD-10-CM | POA: Insufficient documentation

## 2023-10-15 DIAGNOSIS — R7401 Elevation of levels of liver transaminase levels: Secondary | ICD-10-CM | POA: Insufficient documentation

## 2023-10-15 DIAGNOSIS — R051 Acute cough: Secondary | ICD-10-CM | POA: Insufficient documentation

## 2023-10-15 LAB — HEPATIC FUNCTION PANEL
ALT: 126 U/L — ABNORMAL HIGH (ref 0–44)
AST: 92 U/L — ABNORMAL HIGH (ref 15–41)
Albumin: 2.5 g/dL — ABNORMAL LOW (ref 3.5–5.0)
Alkaline Phosphatase: 113 U/L (ref 38–126)
Bilirubin, Direct: 0.2 mg/dL (ref 0.0–0.2)
Indirect Bilirubin: 0.3 mg/dL (ref 0.3–0.9)
Total Bilirubin: 0.5 mg/dL (ref 0.0–1.2)
Total Protein: 5.5 g/dL — ABNORMAL LOW (ref 6.5–8.1)

## 2023-10-15 LAB — URINALYSIS, W/ REFLEX TO CULTURE (INFECTION SUSPECTED)
Bilirubin Urine: NEGATIVE
Glucose, UA: NEGATIVE mg/dL
Ketones, ur: NEGATIVE mg/dL
Leukocytes,Ua: NEGATIVE
Nitrite: NEGATIVE
Protein, ur: NEGATIVE mg/dL
Specific Gravity, Urine: >1.046 — ABNORMAL HIGH (ref 1.005–1.030)
pH: 5 (ref 5.0–8.0)

## 2023-10-15 LAB — BASIC METABOLIC PANEL WITH GFR
Anion gap: 12 (ref 5–15)
BUN: 11 mg/dL (ref 6–20)
CO2: 24 mmol/L (ref 22–32)
Calcium: 8.3 mg/dL — ABNORMAL LOW (ref 8.9–10.3)
Chloride: 103 mmol/L (ref 98–111)
Creatinine, Ser: 0.52 mg/dL (ref 0.44–1.00)
GFR, Estimated: >60 mL/min (ref >60)
Glucose, Bld: 125 mg/dL — ABNORMAL HIGH (ref 70–99)
Potassium: 2.7 mmol/L — CL (ref 3.5–5.1)
Sodium: 139 mmol/L (ref 135–145)

## 2023-10-15 LAB — CBC
HCT: 43.7 % (ref 36.0–46.0)
Hemoglobin: 14.8 g/dL (ref 12.0–15.0)
MCH: 31.8 pg (ref 26.0–34.0)
MCHC: 33.9 g/dL (ref 30.0–36.0)
MCV: 94 fL (ref 80.0–100.0)
Platelets: 159 10*3/uL (ref 150–400)
RBC: 4.65 MIL/uL (ref 3.87–5.11)
RDW: 13.4 % (ref 11.5–15.5)
WBC: 7 10*3/uL (ref 4.0–10.5)
nRBC: 0 % (ref 0.0–0.2)

## 2023-10-15 LAB — RESP PANEL BY RT-PCR (RSV, FLU A&B, COVID)  RVPGX2
Influenza A by PCR: NEGATIVE
Influenza B by PCR: NEGATIVE
Resp Syncytial Virus by PCR: NEGATIVE
SARS Coronavirus 2 by RT PCR: NEGATIVE

## 2023-10-15 LAB — TROPONIN I (HIGH SENSITIVITY)
Troponin I (High Sensitivity): 13 ng/L (ref <18)
Troponin I (High Sensitivity): 10 ng/L (ref <18)

## 2023-10-15 LAB — LACTIC ACID, PLASMA
Lactic Acid, Venous: 2.6 mmol/L (ref 0.5–1.9)
Lactic Acid, Venous: 1.7 mmol/L (ref 0.5–1.9)

## 2023-10-15 MED ORDER — SODIUM CHLORIDE 0.9 % IV SOLN
2.0000 g | Freq: Once | INTRAVENOUS | Status: AC
  Administered 2023-10-15: 2 g via INTRAVENOUS
  Filled 2023-10-15: qty 20

## 2023-10-15 MED ORDER — POTASSIUM CHLORIDE 10 MEQ/100ML IV SOLN
10.0000 meq | INTRAVENOUS | Status: AC
  Administered 2023-10-15 (×2): 10 meq via INTRAVENOUS
  Filled 2023-10-15: qty 100

## 2023-10-15 MED ORDER — AZITHROMYCIN 500 MG IV SOLR
500.0000 mg | Freq: Once | INTRAVENOUS | Status: AC
  Administered 2023-10-15: 500 mg via INTRAVENOUS
  Filled 2023-10-15: qty 5

## 2023-10-15 MED ORDER — GADOBUTROL 1 MMOL/ML IV SOLN
6.0000 mL | Freq: Once | INTRAVENOUS | Status: AC | PRN
  Administered 2023-10-15: 6 mL via INTRAVENOUS

## 2023-10-15 MED ORDER — SODIUM CHLORIDE 0.9 % IV BOLUS (SEPSIS)
1000.0000 mL | Freq: Once | INTRAVENOUS | Status: AC
  Administered 2023-10-15: 1000 mL via INTRAVENOUS

## 2023-10-15 MED ORDER — ACETAMINOPHEN 325 MG PO TABS
650.0000 mg | ORAL_TABLET | Freq: Once | ORAL | Status: AC | PRN
  Administered 2023-10-15: 650 mg via ORAL
  Filled 2023-10-15: qty 2

## 2023-10-15 MED ORDER — SODIUM CHLORIDE 0.9 % IV BOLUS
1000.0000 mL | Freq: Once | INTRAVENOUS | Status: AC
  Administered 2023-10-15: 1000 mL via INTRAVENOUS

## 2023-10-15 MED ORDER — POTASSIUM CHLORIDE CRYS ER 20 MEQ PO TBCR
40.0000 meq | EXTENDED_RELEASE_TABLET | Freq: Once | ORAL | Status: AC
  Administered 2023-10-15: 40 meq via ORAL
  Filled 2023-10-15: qty 2

## 2023-10-15 MED ORDER — IOHEXOL 350 MG/ML SOLN
75.0000 mL | Freq: Once | INTRAVENOUS | Status: AC | PRN
Start: 2023-10-15 — End: 2023-10-15
  Administered 2023-10-15: 75 mL via INTRAVENOUS

## 2023-10-15 NOTE — ED Notes (Addendum)
 Relayed to Dr. Synetta Eves and Ace Abu, RN patients Potassium lab value 2.7 per lab

## 2023-10-15 NOTE — Sepsis Progress Note (Signed)
 Notified bedside nurse of need to draw repeat lactic acid, also asked to check with lab about blood cultures.

## 2023-10-15 NOTE — Discharge Instructions (Signed)
 You were seen in the ER today for evaluation of your fever.  Your testing fortunately did not show an emergency cause for this.  Your symptoms may be related to a viral illness.  Follow-up your primary care doctor for further evaluation.  Return to the ER for new or worsening symptoms.

## 2023-10-15 NOTE — ED Triage Notes (Addendum)
 Pt to ED via POV from [facility]. Pt reports upper back pain, fever, chills, SOB x1wk. Pt was seen at [facility] center and negative for covid and flu.   Pt used inhaler PTA

## 2023-10-15 NOTE — Sepsis Progress Note (Signed)
Notified bedside nurse of need to draw blood cultures.  

## 2023-10-15 NOTE — ED Provider Notes (Signed)
 [facility] Provider Note    Event Date/Time   First MD Initiated Contact with Patient 10/15/23 1534     (approximate)   History   Fever   HPI  Hannah Carey is a 53 year old female presenting to the emergency department for evaluation of fever, cough, back pain.  About a week ago, patient began to have onset of back pain in both her upper and lower back.  She also noticed some congestion and slightly increased cough from her baseline.  She has had ongoing fevers and chills since that time.  Was seen at a clinic and was placed on Augmentin and albuterol inhaler which she has been using without improvement.  She continues to be febrile up to a Tmax of 103.  She denies history of chronic back pain or surgery.  Denies history of IVDU.      Physical Exam   Triage Vital Signs: ED Triage Vitals [10/15/23 1443]  Encounter Vitals Group     BP (!) 135/109     Girls Systolic BP Percentile      Girls Diastolic BP Percentile      Boys Systolic BP Percentile      Boys Diastolic BP Percentile      Pulse Rate (!) 126     Resp 20     Temp (!) 102.2 F (39 C)     Temp Source Oral     SpO2 96 %     Weight 141 lb (64 kg)     Height 5' 3 (1.6 m)     Head Circumference      Peak Flow      Pain Score 7     Pain Loc      Pain Education      Exclude from Growth Chart     Most recent vital signs: Vitals:   10/15/23 1840 10/15/23 1930  BP:  100/61  Pulse:  83  Resp:  (!) 21  Temp: 98.7 F (37.1 C)   SpO2:  92%     General: Awake, interactive  CV:  Tachycardic with regular rhythm Resp:  Unlabored respirations, lungs clear to auscultation Abd:  Nondistended, soft, nontender Back:   Midline tenderness along the lower thoracic and upper lumbar spine without focal area of point tenderness, no cervical pain Neuro:  Symmetric facial movement, fluid speech, 5 out of 5 strength in the bilateral upper and lower extremities with normal sensation   ED Results /  Procedures / Treatments   Labs (all labs ordered are listed, but only abnormal results are displayed) Labs Reviewed  BASIC METABOLIC PANEL WITH GFR - Abnormal; Notable for the following components:      Result Value   Potassium 2.7 (*)    Glucose, Bld 125 (*)    Calcium 8.3 (*)    All other components within normal limits  LACTIC ACID, PLASMA - Abnormal; Notable for the following components:   Lactic Acid, Venous 2.6 (*)    All other components within normal limits  HEPATIC FUNCTION PANEL - Abnormal; Notable for the following components:   Total Protein 5.5 (*)    Albumin 2.5 (*)    AST 92 (*)    ALT 126 (*)    All other components within normal limits  URINALYSIS, W/ REFLEX TO CULTURE (INFECTION SUSPECTED) - Abnormal; Notable for the following components:   Color, Urine YELLOW (*)    APPearance CLEAR (*)    Specific Gravity, Urine >1.046 (*)  Hgb urine dipstick SMALL (*)    Bacteria, UA RARE (*)    All other components within normal limits  RESP PANEL BY RT-PCR (RSV, FLU A&B, COVID)  [id number]  CULTURE, BLOOD (ROUTINE X 2)  CULTURE, BLOOD (ROUTINE X 2)  CBC  LACTIC ACID, PLASMA  TROPONIN I (HIGH SENSITIVITY)  TROPONIN I (HIGH SENSITIVITY)     EKG EKG independently reviewed and interpreted by myself demonstrates:  EKG demonstrates sinus tachycardia at a rate of 111, PR 158, QRS 70, QTc 456, no acute ST changes  RADIOLOGY Imaging independently reviewed and interpreted by myself demonstrates:  CXR without focal consolidation CTA chest without PE MRI thoracic and lumbar spine without evidence of acute infection.  Formal Radiology Read:  MR THORACIC SPINE W WO CONTRAST Result Date: 10/15/2023 CLINICAL DATA:  Initial evaluation for acute fat gray, fever, evaluate for infection. EXAM: MRI THORACIC AND LUMBAR SPINE WITHOUT AND WITH CONTRAST TECHNIQUE: Multiplanar and multiecho pulse sequences of the thoracic and lumbar spine were obtained without and with intravenous  contrast. CONTRAST:  6mL GADAVIST GADOBUTROL 1 MMOL/ML IV SOLN COMPARISON:  None Available. FINDINGS: MRI THORACIC SPINE FINDINGS Alignment: Vertebral bodies normally aligned with preservation of the normal thoracic kyphosis. No listhesis. Vertebrae: Chronic endplate Schmorl's node deformity with associated height loss present at the superior endplate of T9. Vertebral body height otherwise maintained with no other acute or chronic fracture. Bone marrow signal intensity within normal limits. No worrisome osseous lesions. No evidence for osteomyelitis discitis or septic arthritis. Cord: Normal signal and morphology. No epidural abscess or other collection. No abnormal enhancement. Paraspinal and other soft tissues: Paraspinous soft tissues within normal limits. Trace layering right pleural effusion noted. 1.6 cm simple left renal cyst noted, benign in appearance, no follow-up imaging recommended. Disc levels: T12-L1: Small left paracentral disc protrusion mildly indents the ventral thecal sac (series 20, image 38). No spinal stenosis. Foramina remain patent. Otherwise, no other significant disc pathology seen within the thoracic spine. No other significant disc bulge or focal disc herniation. Mild lower thoracic facet hypertrophy at T9-10 and T10-11. No spinal stenosis. Foramina remain patent. MRI LUMBAR SPINE FINDINGS Segmentation: Transitional lumbosacral anatomy with sacralization of the L5 vertebral body. L5-S1 interspaces rudimentary. Alignment: 3 mm degenerative retrolisthesis of L4 on L5. Trace retrolisthesis of T12 on L1. Alignment otherwise normal preservation of the normal lumbar lordosis. Vertebrae: Vertebral body height maintained without acute or chronic fracture. Bone marrow signal intensity within normal limits. No worrisome osseous lesions. No evidence for osteomyelitis discitis or septic arthritis. Conus medullaris: Extends to the T12-L1 level and appears normal. No epidural collections. No abnormal  enhancement. Paraspinal and other soft tissues: Paraspinous soft tissues within normal limits. 1.6 cm simple left renal cyst, benign in appearance, no follow-up imaging recommended. Disc levels: L1-2: Normal interspace. Mild bilateral facet spurring. No spinal stenosis. Foramina remain patent. L2-3: Disc desiccation with mild diffuse disc bulge. Mild to moderate left greater than right facet hypertrophy. No spinal stenosis. Foramina remain patent. L3-4: Disc desiccation with diffuse disc bulge. Shallow left subarticular disc protrusion with annular fissure and slight caudad angulation closely approximates the descending left L4 nerve root. Moderate bilateral facet hypertrophy with trace joint effusions. Resultant mild narrowing of the lateral recesses bilaterally. Central canal remains patent. Mild to moderate bilateral L3 foraminal stenosis. L4-5: Degenerative disc space narrowing with diffuse disc bulge. Reactive endplate change with marginal endplate osteophytic spurring. Resultant broad base posterior disc osteophyte complex with central annular fissure. Mild bilateral facet hypertrophy. No  significant spinal stenosis. Mild bilateral L4 foraminal narrowing. L5-S1: Transitional lumbosacral anatomy with rudimentary L5-S1 interspace. No disc bulge or focal disc herniation. Mild facet degeneration. No stenosis. IMPRESSION: MRI THORACIC SPINE: 1. No evidence for acute infection within the thoracic spine. 2. Small left paracentral disc protrusion at T12-L1 without stenosis. 3. Trace layering right pleural effusion. MRI LUMBAR SPINE: 1. No evidence for acute infection within the lumbar spine. 2. Shallow left subarticular disc protrusion at L3-4, potentially affecting the descending left L4 nerve root. 3. Mild to moderate bilateral L3 and L4 foraminal stenosis related to disc bulge and facet hypertrophy. 4. Transitional lumbosacral anatomy with sacralization of the L5 vertebral body. Careful correlation with numbering  system on this exam recommended prior to any potential future intervention. Electronically Signed   By: Virgia Griffins M.D.   On: 10/15/2023 21:06   MR Lumbar Spine W Wo Contrast Result Date: 10/15/2023 CLINICAL DATA:  Initial evaluation for acute fat gray, fever, evaluate for infection. EXAM: MRI THORACIC AND LUMBAR SPINE WITHOUT AND WITH CONTRAST TECHNIQUE: Multiplanar and multiecho pulse sequences of the thoracic and lumbar spine were obtained without and with intravenous contrast. CONTRAST:  6mL GADAVIST GADOBUTROL 1 MMOL/ML IV SOLN COMPARISON:  None Available. FINDINGS: MRI THORACIC SPINE FINDINGS Alignment: Vertebral bodies normally aligned with preservation of the normal thoracic kyphosis. No listhesis. Vertebrae: Chronic endplate Schmorl's node deformity with associated height loss present at the superior endplate of T9. Vertebral body height otherwise maintained with no other acute or chronic fracture. Bone marrow signal intensity within normal limits. No worrisome osseous lesions. No evidence for osteomyelitis discitis or septic arthritis. Cord: Normal signal and morphology. No epidural abscess or other collection. No abnormal enhancement. Paraspinal and other soft tissues: Paraspinous soft tissues within normal limits. Trace layering right pleural effusion noted. 1.6 cm simple left renal cyst noted, benign in appearance, no follow-up imaging recommended. Disc levels: T12-L1: Small left paracentral disc protrusion mildly indents the ventral thecal sac (series 20, image 38). No spinal stenosis. Foramina remain patent. Otherwise, no other significant disc pathology seen within the thoracic spine. No other significant disc bulge or focal disc herniation. Mild lower thoracic facet hypertrophy at T9-10 and T10-11. No spinal stenosis. Foramina remain patent. MRI LUMBAR SPINE FINDINGS Segmentation: Transitional lumbosacral anatomy with sacralization of the L5 vertebral body. L5-S1 interspaces  rudimentary. Alignment: 3 mm degenerative retrolisthesis of L4 on L5. Trace retrolisthesis of T12 on L1. Alignment otherwise normal preservation of the normal lumbar lordosis. Vertebrae: Vertebral body height maintained without acute or chronic fracture. Bone marrow signal intensity within normal limits. No worrisome osseous lesions. No evidence for osteomyelitis discitis or septic arthritis. Conus medullaris: Extends to the T12-L1 level and appears normal. No epidural collections. No abnormal enhancement. Paraspinal and other soft tissues: Paraspinous soft tissues within normal limits. 1.6 cm simple left renal cyst, benign in appearance, no follow-up imaging recommended. Disc levels: L1-2: Normal interspace. Mild bilateral facet spurring. No spinal stenosis. Foramina remain patent. L2-3: Disc desiccation with mild diffuse disc bulge. Mild to moderate left greater than right facet hypertrophy. No spinal stenosis. Foramina remain patent. L3-4: Disc desiccation with diffuse disc bulge. Shallow left subarticular disc protrusion with annular fissure and slight caudad angulation closely approximates the descending left L4 nerve root. Moderate bilateral facet hypertrophy with trace joint effusions. Resultant mild narrowing of the lateral recesses bilaterally. Central canal remains patent. Mild to moderate bilateral L3 foraminal stenosis. L4-5: Degenerative disc space narrowing with diffuse disc bulge. Reactive endplate change with marginal  endplate osteophytic spurring. Resultant broad base posterior disc osteophyte complex with central annular fissure. Mild bilateral facet hypertrophy. No significant spinal stenosis. Mild bilateral L4 foraminal narrowing. L5-S1: Transitional lumbosacral anatomy with rudimentary L5-S1 interspace. No disc bulge or focal disc herniation. Mild facet degeneration. No stenosis. IMPRESSION: MRI THORACIC SPINE: 1. No evidence for acute infection within the thoracic spine. 2. Small left  paracentral disc protrusion at T12-L1 without stenosis. 3. Trace layering right pleural effusion. MRI LUMBAR SPINE: 1. No evidence for acute infection within the lumbar spine. 2. Shallow left subarticular disc protrusion at L3-4, potentially affecting the descending left L4 nerve root. 3. Mild to moderate bilateral L3 and L4 foraminal stenosis related to disc bulge and facet hypertrophy. 4. Transitional lumbosacral anatomy with sacralization of the L5 vertebral body. Careful correlation with numbering system on this exam recommended prior to any potential future intervention. Electronically Signed   By: Virgia Griffins M.D.   On: 10/15/2023 21:06   CT Angio Chest PE W and/or Wo Contrast Result Date: 10/15/2023 CLINICAL DATA:  Concern for pulmonary embolism. EXAM: CT ANGIOGRAPHY CHEST WITH CONTRAST TECHNIQUE: Multidetector CT imaging of the chest was performed using the standard protocol during bolus administration of intravenous contrast. Multiplanar CT image reconstructions and MIPs were obtained to evaluate the vascular anatomy. RADIATION DOSE REDUCTION: This exam was performed according to the departmental dose-optimization program which includes automated exposure control, adjustment of the mA and/or kV according to patient size and/or use of iterative reconstruction technique. CONTRAST:  75mL OMNIPAQUE IOHEXOL 350 MG/ML SOLN COMPARISON:  Chest radiograph dated 10/15/2023. FINDINGS: Cardiovascular: There is no cardiomegaly or pericardial effusion. There is coronary vascular calcification. The thoracic aorta is unremarkable. The origins of the great vessels of the aortic arch are patent. No pulmonary artery embolus identified. Mediastinum/Nodes: No hilar or mediastinal adenopathy. The esophagus and the thyroid gland are grossly unremarkable. No mediastinal fluid collection. Lungs/Pleura: Background of emphysema. Minimal right lung base atelectasis. No focal consolidation, pleural effusion, pneumothorax.  The central airways are patent. Upper Abdomen: A 2.5 cm right adrenal adenoma. Musculoskeletal: No acute osseous pathology. Review of the MIP images confirms the above findings. IMPRESSION: 1. No acute intrathoracic pathology. No CT evidence of pulmonary artery embolus. 2.  Emphysema (ICD10-J43.9). Electronically Signed   By: Angus Bark M.D.   On: 10/15/2023 17:37   DG Chest 2 View Result Date: 10/15/2023 CLINICAL DATA:  sob EXAM: CHEST - 2 VIEW COMPARISON:  May 13, 2016 FINDINGS: No focal airspace consolidation, pleural effusion, or pneumothorax. No cardiomegaly. No acute fracture or destructive lesion. IMPRESSION: No acute cardiopulmonary abnormality. Electronically Signed   By: Rance Burrows M.D.   On: 10/15/2023 15:48    PROCEDURES:  Critical Care performed: No  Procedures   MEDICATIONS ORDERED IN ED: Medications  acetaminophen  (TYLENOL ) tablet 650 mg (650 mg Oral Given 10/15/23 1448)  sodium chloride 0.9 % bolus 1,000 mL (0 mLs Intravenous Stopped 10/15/23 1840)  cefTRIAXone (ROCEPHIN) 2 g in sodium chloride 0.9 % 100 mL IVPB (0 g Intravenous Stopped 10/15/23 1728)  azithromycin (ZITHROMAX) 500 mg in sodium chloride 0.9 % 250 mL IVPB (0 mg Intravenous Stopped 10/15/23 1840)  potassium chloride  10 mEq in 100 mL IVPB (0 mEq Intravenous Stopped 10/15/23 1840)  potassium chloride  SA (KLOR-CON  M) CR tablet 40 mEq (40 mEq Oral Given 10/15/23 1619)  iohexol (OMNIPAQUE) 350 MG/ML injection 75 mL (75 mLs Intravenous Contrast Given 10/15/23 1725)  sodium chloride 0.9 % bolus 1,000 mL (1,000 mLs Intravenous New Bag/Given 10/15/23  1841)  gadobutrol (GADAVIST) 1 MMOL/ML injection 6 mL (6 mLs Intravenous Contrast Given 10/15/23 2012)     IMPRESSION / MDM / ASSESSMENT AND PLAN / ED COURSE  I reviewed the triage vital signs and the nursing notes.  Differential diagnosis includes, but is not limited to, pneumonia, pulmonary embolism, epidural abscess, respiratory viral illness  Patient's  presentation is most consistent with acute presentation with potential threat to life or bodily function.  53 year old female presenting to the emergency department for evaluation of fever.  Has had some respiratory symptoms, but x-Madelon Welsch here negative and has had persistent fevers despite initiation of Augmentin.  She additionally notes back pain that is new for her with focal midline tenderness in her thoracic and lumbar spine.  No clear risk factors for epidural abscess, but unclear source of her fever and tachycardia.  Does meet sepsis criteria.  Ordered for empiric Rocephin and azithromycin given reported respiratory symptoms.  Will obtain CT of the chest to further evaluate for occult pneumonia as well as PE.  MRI of the thoracic and lumbar spine also ordered.  Labs with reassuring CBC, BMP with low potassium with K of 2.7, orally and IV repletion ordered.  LFTs with mild transaminitis.  Negative troponin.  Viral swab negative.  Initial lactate slightly elevated, cleared on repeat after fluids.  Urine without evidence of infection.  CT of the chest without evidence of PE.  MRI of the T and L-spine without evidence of infection.  Patient reassessed.  Feels improved on reevaluation.  Improvement in heart rate with fluids and antipyretics.  Fever resolved.  In the absence of alternative source, suspect likely viral illness contributing to patient's symptoms.  She is comfortable with discharge home.  Strict return precautions provided.  Patient discharged stable condition.      FINAL CLINICAL IMPRESSION(S) / ED DIAGNOSES   Final diagnoses:  Fever, unspecified fever cause  Acute cough  Acute midline thoracic back pain     Rx / DC Orders   ED Discharge Orders     None        Note:  This document was prepared using Dragon voice recognition software and may include unintentional dictation errors.   Claria Crofts, MD 10/15/23 2134

## 2023-10-15 NOTE — Sepsis Progress Note (Signed)
 Phone call to nurse about blood cultures, states they were collected(not documented anywhere)

## 2023-10-15 NOTE — Sepsis Progress Note (Signed)
 Elink monitoring for the code sepsis protocol.

## 2023-10-15 NOTE — Sepsis Progress Note (Signed)
 Lab called and stated they had the blood cultures

## 2023-10-20 LAB — CULTURE, BLOOD (ROUTINE X 2)
Culture: NO GROWTH
Culture: NO GROWTH
Special Requests: ADEQUATE
